# Patient Record
Sex: Female | Born: 1955 | Race: White | Hispanic: No | Smoking: Former smoker
Health system: Southern US, Community
[De-identification: ages and names within clinical notes are randomized; demographics above are authoritative.]

## PROBLEM LIST (undated history)

## (undated) DIAGNOSIS — M1990 Unspecified osteoarthritis, unspecified site: Secondary | ICD-10-CM

## (undated) DIAGNOSIS — F32A Depression, unspecified: Secondary | ICD-10-CM

## (undated) DIAGNOSIS — J189 Pneumonia, unspecified organism: Secondary | ICD-10-CM

## (undated) DIAGNOSIS — I10 Essential (primary) hypertension: Secondary | ICD-10-CM

## (undated) DIAGNOSIS — J449 Chronic obstructive pulmonary disease, unspecified: Secondary | ICD-10-CM

## (undated) DIAGNOSIS — F329 Major depressive disorder, single episode, unspecified: Secondary | ICD-10-CM

## (undated) DIAGNOSIS — F419 Anxiety disorder, unspecified: Secondary | ICD-10-CM

## (undated) HISTORY — PX: BACK SURGERY: SHX140

## (undated) HISTORY — PX: CHOLECYSTECTOMY: SHX55

## (undated) HISTORY — PX: COLONOSCOPY W/ POLYPECTOMY: SHX1380

## (undated) HISTORY — DX: Depression, unspecified: F32.A

## (undated) HISTORY — DX: Anxiety disorder, unspecified: F41.9

## (undated) HISTORY — DX: Major depressive disorder, single episode, unspecified: F32.9

---

## 1975-09-08 HISTORY — PX: GALLBLADDER SURGERY: SHX652

## 1979-06-10 HISTORY — PX: ABDOMINAL HYSTERECTOMY: SHX81

## 1998-11-07 ENCOUNTER — Ambulatory Visit (HOSPITAL_COMMUNITY): Admission: RE | Admit: 1998-11-07 | Discharge: 1998-11-08 | Payer: Self-pay | Admitting: Neurosurgery

## 1998-11-07 ENCOUNTER — Encounter: Payer: Self-pay | Admitting: Neurosurgery

## 1998-12-20 ENCOUNTER — Encounter: Payer: Self-pay | Admitting: Neurosurgery

## 1998-12-20 ENCOUNTER — Ambulatory Visit (HOSPITAL_COMMUNITY): Admission: RE | Admit: 1998-12-20 | Discharge: 1998-12-20 | Payer: Self-pay | Admitting: Neurosurgery

## 1998-12-21 ENCOUNTER — Ambulatory Visit (HOSPITAL_COMMUNITY): Admission: AD | Admit: 1998-12-21 | Discharge: 1998-12-22 | Payer: Self-pay | Admitting: Neurosurgery

## 1998-12-21 ENCOUNTER — Encounter: Payer: Self-pay | Admitting: Neurosurgery

## 2001-06-09 HISTORY — PX: SPINAL CORD STIMULATOR IMPLANT: SHX2422

## 2004-03-14 ENCOUNTER — Ambulatory Visit: Payer: Self-pay | Admitting: Family Medicine

## 2004-03-27 ENCOUNTER — Ambulatory Visit: Payer: Self-pay | Admitting: Physician Assistant

## 2004-04-24 ENCOUNTER — Ambulatory Visit: Payer: Self-pay | Admitting: Physician Assistant

## 2004-05-28 ENCOUNTER — Ambulatory Visit: Payer: Self-pay | Admitting: Physician Assistant

## 2004-06-27 ENCOUNTER — Ambulatory Visit: Payer: Self-pay | Admitting: Physician Assistant

## 2004-07-25 ENCOUNTER — Ambulatory Visit: Payer: Self-pay | Admitting: Physician Assistant

## 2004-08-27 ENCOUNTER — Ambulatory Visit: Payer: Self-pay | Admitting: Physician Assistant

## 2004-09-23 ENCOUNTER — Ambulatory Visit: Payer: Self-pay | Admitting: Pain Medicine

## 2004-09-23 ENCOUNTER — Ambulatory Visit: Payer: Self-pay | Admitting: Physician Assistant

## 2004-10-23 ENCOUNTER — Ambulatory Visit: Payer: Self-pay | Admitting: Physician Assistant

## 2004-11-21 ENCOUNTER — Ambulatory Visit: Payer: Self-pay | Admitting: Physician Assistant

## 2004-12-18 ENCOUNTER — Ambulatory Visit: Payer: Self-pay | Admitting: Physician Assistant

## 2005-01-17 ENCOUNTER — Ambulatory Visit: Payer: Self-pay

## 2005-01-21 ENCOUNTER — Ambulatory Visit: Payer: Self-pay | Admitting: Physician Assistant

## 2005-02-11 ENCOUNTER — Ambulatory Visit: Payer: Self-pay | Admitting: Physician Assistant

## 2005-03-17 ENCOUNTER — Ambulatory Visit: Payer: Self-pay | Admitting: Physician Assistant

## 2005-04-07 ENCOUNTER — Ambulatory Visit: Payer: Self-pay | Admitting: Family Medicine

## 2005-04-15 ENCOUNTER — Ambulatory Visit: Payer: Self-pay | Admitting: Physician Assistant

## 2005-05-16 ENCOUNTER — Ambulatory Visit: Payer: Self-pay | Admitting: Physician Assistant

## 2005-06-18 ENCOUNTER — Ambulatory Visit: Payer: Self-pay | Admitting: Physician Assistant

## 2005-07-17 ENCOUNTER — Ambulatory Visit: Payer: Self-pay | Admitting: Physician Assistant

## 2005-08-12 ENCOUNTER — Ambulatory Visit: Payer: Self-pay | Admitting: Physician Assistant

## 2005-09-11 ENCOUNTER — Ambulatory Visit: Payer: Self-pay | Admitting: Physician Assistant

## 2005-10-13 ENCOUNTER — Ambulatory Visit: Payer: Self-pay | Admitting: Physician Assistant

## 2005-11-13 ENCOUNTER — Ambulatory Visit: Payer: Self-pay | Admitting: Physician Assistant

## 2005-12-12 ENCOUNTER — Ambulatory Visit: Payer: Self-pay | Admitting: Physician Assistant

## 2006-01-09 ENCOUNTER — Ambulatory Visit: Payer: Self-pay | Admitting: Physician Assistant

## 2006-01-19 ENCOUNTER — Ambulatory Visit: Payer: Self-pay | Admitting: Family Medicine

## 2006-02-11 ENCOUNTER — Ambulatory Visit: Payer: Self-pay | Admitting: Pain Medicine

## 2006-03-12 ENCOUNTER — Ambulatory Visit: Payer: Self-pay | Admitting: Physician Assistant

## 2006-04-09 ENCOUNTER — Ambulatory Visit: Payer: Self-pay | Admitting: Physician Assistant

## 2006-05-07 ENCOUNTER — Ambulatory Visit: Payer: Self-pay

## 2006-05-07 ENCOUNTER — Ambulatory Visit: Payer: Self-pay | Admitting: Physician Assistant

## 2006-05-18 ENCOUNTER — Inpatient Hospital Stay: Payer: Self-pay | Admitting: Internal Medicine

## 2006-05-18 ENCOUNTER — Other Ambulatory Visit: Payer: Self-pay

## 2006-05-19 ENCOUNTER — Other Ambulatory Visit: Payer: Self-pay

## 2006-05-27 ENCOUNTER — Ambulatory Visit: Payer: Self-pay | Admitting: Physician Assistant

## 2006-05-28 ENCOUNTER — Ambulatory Visit (HOSPITAL_COMMUNITY): Admission: RE | Admit: 2006-05-28 | Discharge: 2006-05-28 | Payer: Self-pay | Admitting: Gastroenterology

## 2006-06-01 ENCOUNTER — Ambulatory Visit: Payer: Self-pay | Admitting: Internal Medicine

## 2006-06-08 ENCOUNTER — Ambulatory Visit: Payer: Self-pay | Admitting: Gastroenterology

## 2006-06-19 ENCOUNTER — Ambulatory Visit: Payer: Self-pay | Admitting: Gastroenterology

## 2006-07-07 ENCOUNTER — Ambulatory Visit: Payer: Self-pay | Admitting: Physician Assistant

## 2006-08-05 ENCOUNTER — Ambulatory Visit: Payer: Self-pay | Admitting: Physician Assistant

## 2006-09-03 ENCOUNTER — Ambulatory Visit: Payer: Self-pay | Admitting: Physician Assistant

## 2006-09-29 ENCOUNTER — Ambulatory Visit: Payer: Self-pay | Admitting: Physician Assistant

## 2006-10-30 ENCOUNTER — Ambulatory Visit: Payer: Self-pay | Admitting: Physician Assistant

## 2006-12-02 ENCOUNTER — Ambulatory Visit: Payer: Self-pay | Admitting: Physician Assistant

## 2006-12-30 ENCOUNTER — Ambulatory Visit: Payer: Self-pay | Admitting: Physician Assistant

## 2007-02-01 ENCOUNTER — Ambulatory Visit: Payer: Self-pay | Admitting: Physician Assistant

## 2007-03-01 ENCOUNTER — Ambulatory Visit: Payer: Self-pay | Admitting: Family Medicine

## 2007-03-02 ENCOUNTER — Ambulatory Visit: Payer: Self-pay | Admitting: Physician Assistant

## 2007-03-03 ENCOUNTER — Ambulatory Visit: Payer: Self-pay | Admitting: Pain Medicine

## 2007-04-01 ENCOUNTER — Ambulatory Visit: Payer: Self-pay | Admitting: Physician Assistant

## 2007-04-02 ENCOUNTER — Ambulatory Visit: Payer: Self-pay | Admitting: Neonatology

## 2007-04-29 ENCOUNTER — Ambulatory Visit: Payer: Self-pay | Admitting: Physician Assistant

## 2007-05-18 ENCOUNTER — Ambulatory Visit: Payer: Self-pay | Admitting: Pain Medicine

## 2007-05-27 ENCOUNTER — Ambulatory Visit: Payer: Self-pay | Admitting: Physician Assistant

## 2007-06-22 ENCOUNTER — Ambulatory Visit: Payer: Self-pay | Admitting: Physician Assistant

## 2007-07-28 ENCOUNTER — Ambulatory Visit: Payer: Self-pay | Admitting: Physician Assistant

## 2007-08-27 ENCOUNTER — Ambulatory Visit: Payer: Self-pay | Admitting: Physician Assistant

## 2007-10-27 ENCOUNTER — Emergency Department: Payer: Self-pay | Admitting: Emergency Medicine

## 2007-11-02 ENCOUNTER — Other Ambulatory Visit: Payer: Self-pay

## 2007-11-02 ENCOUNTER — Emergency Department: Payer: Self-pay | Admitting: Emergency Medicine

## 2008-03-03 IMAGING — CR DG ELBOW COMPLETE 3+V*L*
1 series · 4 of 4 positions shown · non-contrast
Comparison: none

REASON FOR EXAM: Positive fat pad sign
COMMENTS:

[Series 1: view not recorded · 0.17mm/px · 4 of 4 slices shown]
[im 1/4]
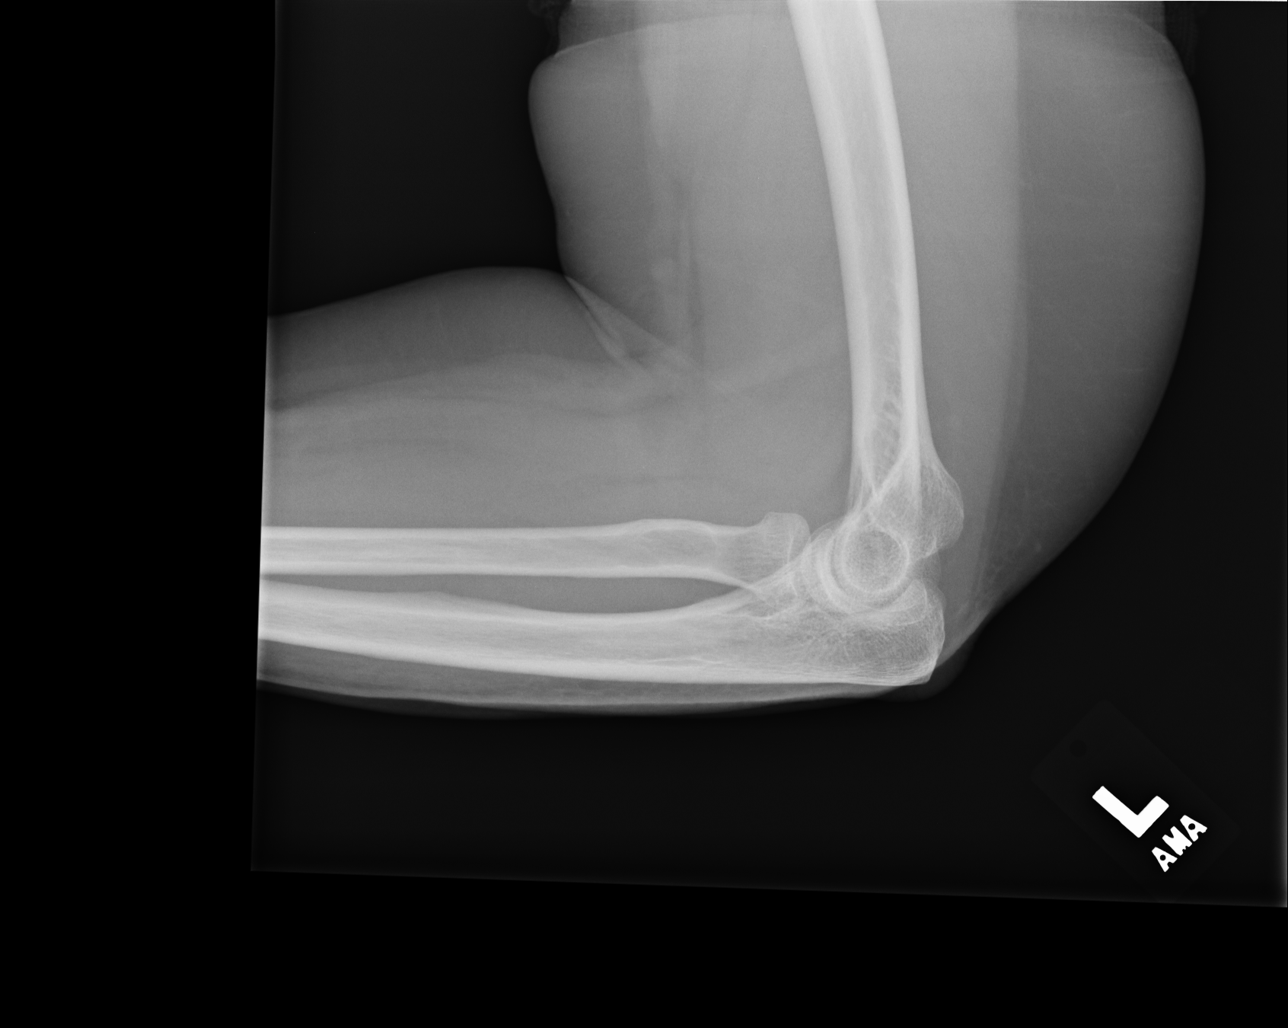
[im 2/4]
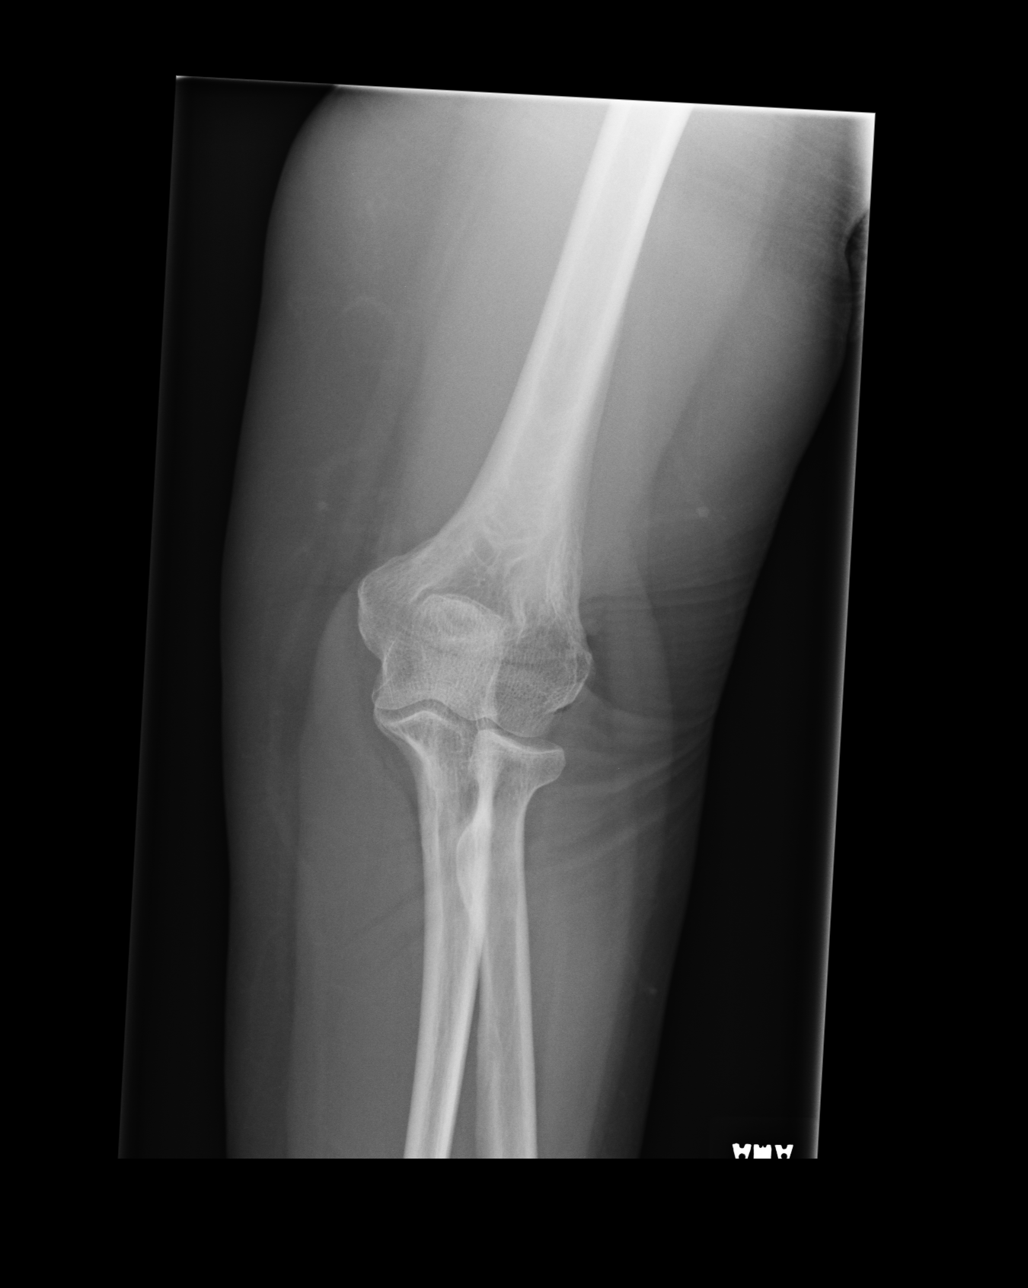
[im 3/4]
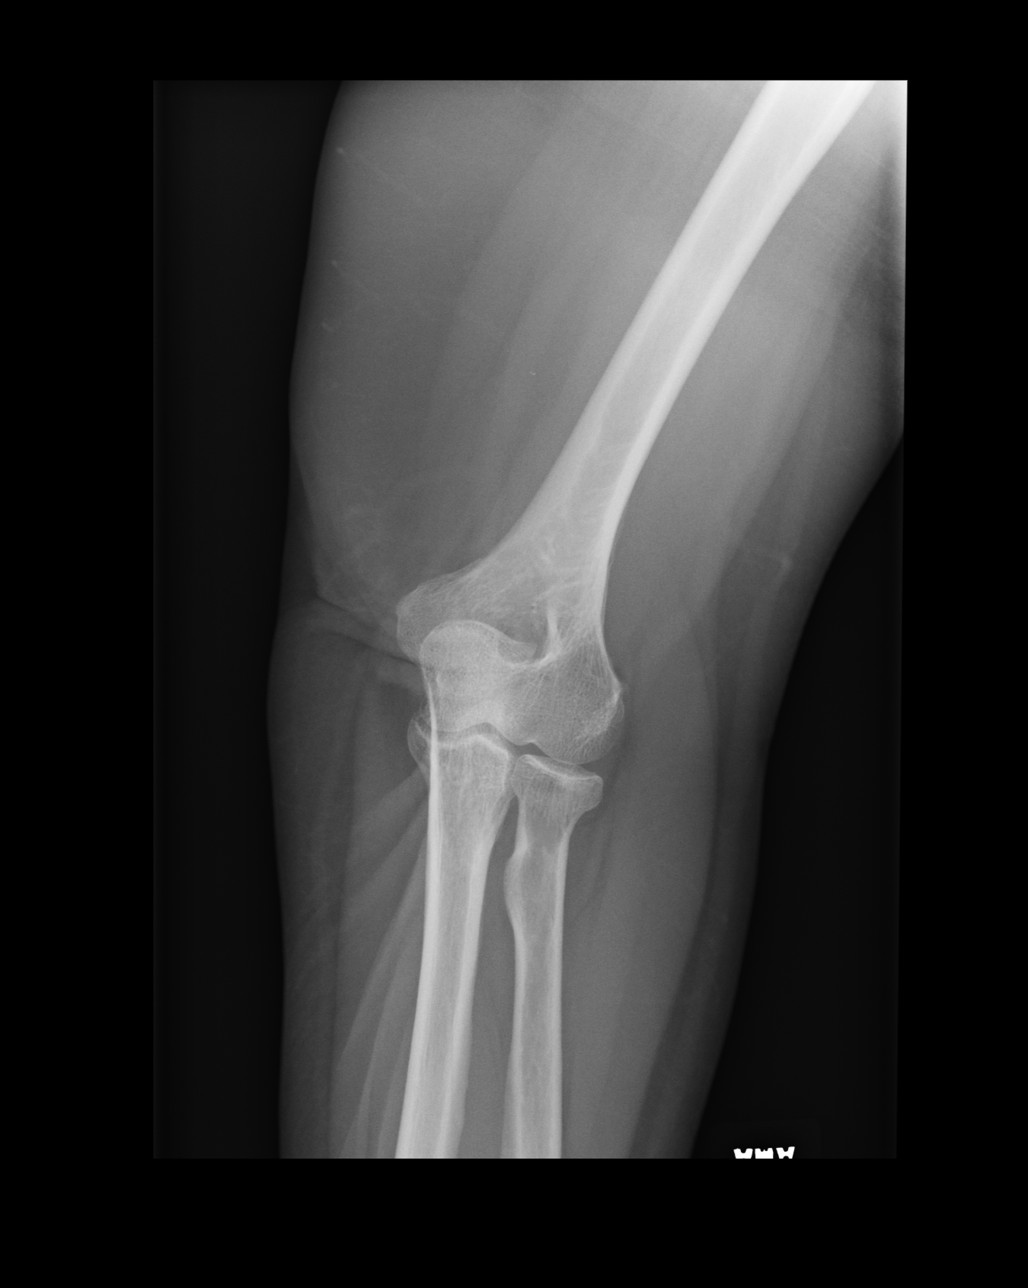
[im 4/4]
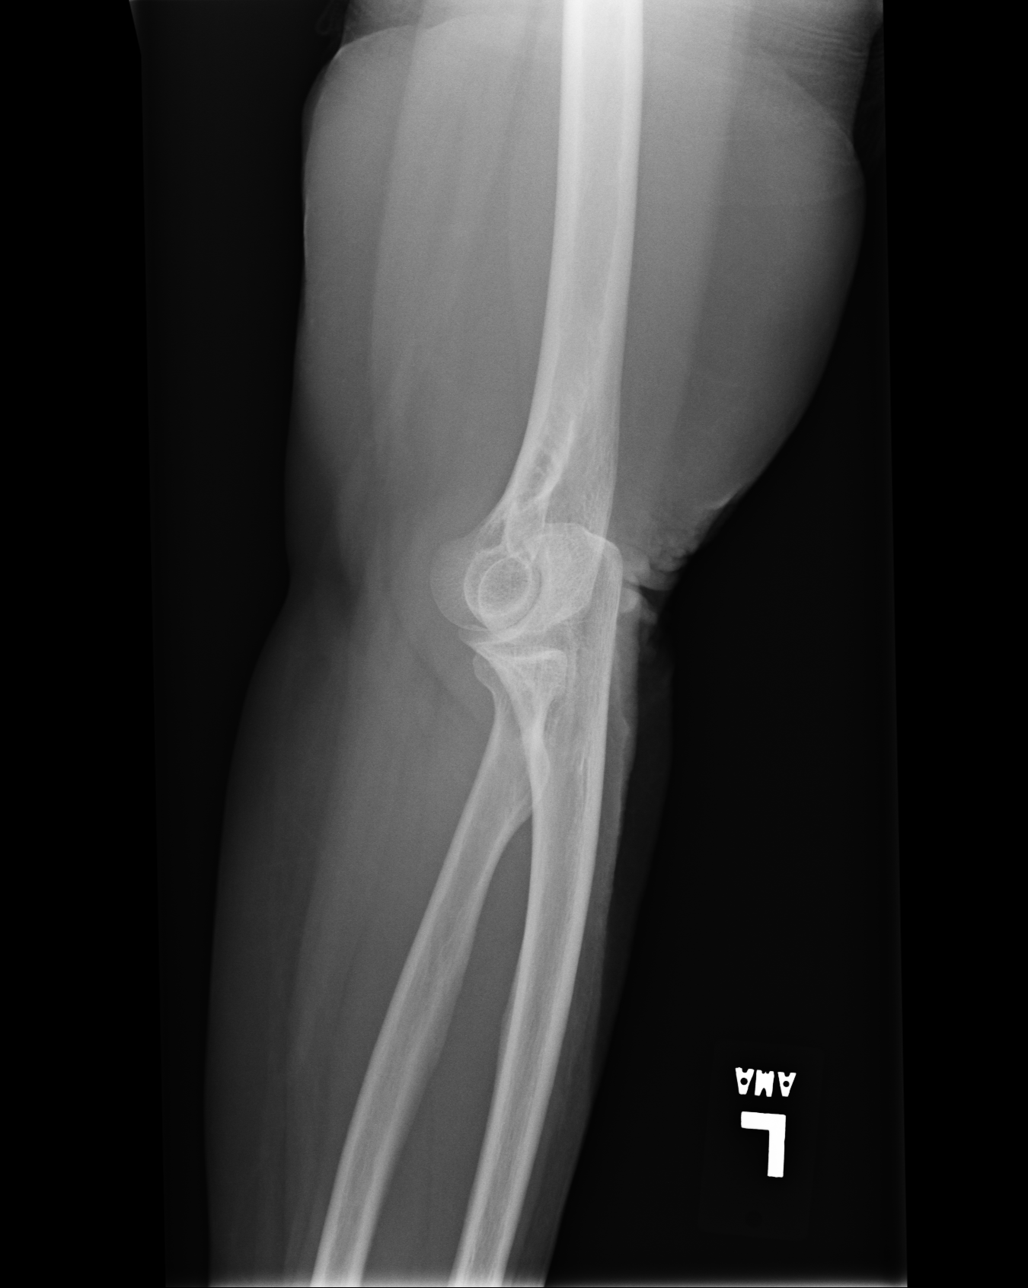

[4 of 4 positions shown; findings below may reference images not displayed]

PROCEDURE:     DXR - DXR ELBOW LT COMP W/OBLIQUES  - April 02, 2007  [DATE]

RESULT:     The bones of the elbow are mildly osteopenic. I do not see
evidence of an acute fracture. No joint effusion is demonstrated. There is
soft tissue prominence both anteriorly and posteriorly over the lower arm. I
do not see more than minimal soft tissue fullness over the olecranon.
IMPRESSION: I do not see acute bony abnormality of the elbow. If there
are worrisome clinical findings in the soft tissues, MRI would be a useful
next step. When compared to a study [DATE], there does appear to be
fullness of the soft tissues of the lower arm which may reflect the presence
of edema or hematoma given the relatively acute onset. No soft tissue gas or
calcification is seen. Underlying neoplasm is felt to be less likely but
again MRI would be a useful next step.

## 2011-08-07 ENCOUNTER — Ambulatory Visit: Payer: Self-pay | Admitting: Internal Medicine

## 2011-09-27 ENCOUNTER — Emergency Department: Payer: Self-pay | Admitting: *Deleted

## 2014-07-05 ENCOUNTER — Encounter: Payer: Self-pay | Admitting: Student

## 2014-07-10 ENCOUNTER — Encounter: Payer: Self-pay | Admitting: Student

## 2014-08-08 ENCOUNTER — Encounter
Admit: 2014-08-08 | Disposition: A | Discharge status: Home / Self Care | Payer: Self-pay | Attending: Student | Admitting: Student

## 2014-09-08 ENCOUNTER — Encounter
Admit: 2014-09-08 | Disposition: A | Discharge status: Home / Self Care | Payer: Self-pay | Attending: Student | Admitting: Student

## 2014-10-12 ENCOUNTER — Ambulatory Visit: Payer: Medicare Other | Attending: Student | Admitting: Physical Therapy

## 2014-10-12 ENCOUNTER — Encounter: Payer: Self-pay | Admitting: Physical Therapy

## 2014-10-12 DIAGNOSIS — M792 Neuralgia and neuritis, unspecified: Secondary | ICD-10-CM | POA: Diagnosis not present

## 2014-10-12 DIAGNOSIS — M961 Postlaminectomy syndrome, not elsewhere classified: Secondary | ICD-10-CM | POA: Insufficient documentation

## 2014-10-12 DIAGNOSIS — M6281 Muscle weakness (generalized): Secondary | ICD-10-CM | POA: Insufficient documentation

## 2014-10-12 DIAGNOSIS — M545 Low back pain, unspecified: Secondary | ICD-10-CM

## 2014-10-12 NOTE — Therapy (Signed)
[place] [facility] [address] [place], [place], [address] Phone: [phone]   Fax:  [phone]  Physical Therapy Treatment  Patient Details  Name: Emma Spears MRN: [id number] Date of Birth: 03-30-1956 Referring Provider:  Buena Irishhidgey, Brooke, MD  Encounter Date: 10/12/2014      PT End of Session - 10/12/14 1202    Visit Number 22   Number of Visits 27   Date for PT Re-Evaluation 11/03/14   Authorization Type 22   Authorization Time Period 30   PT Start Time 1105   PT Stop Time 1200   PT Time Calculation (min) 55 min   Behavior During Therapy [facility] for tasks assessed/performed      Past Medical History  Diagnosis Date  . Anxiety   . Depression     Past Surgical History  Procedure Laterality Date  . Gallbladder surgery N/A 09/1975  . Abdominal hysterectomy N/A 1981  . Back surgery N/A  2 in 2001, 2003,  2 in 2005    There were no vitals filed for this visit.  Visit Diagnosis:  Low back pain, non-specific  Muscle weakness (generalized)      Subjective Assessment - 10/12/14 1123    Subjective Patient reports pain in lower back, bilaterally lefit>right   Limitations Sitting;Standing;Walking;House hold activities   How long can you sit comfortably? 45 min   How long can you stand comfortably? 15 min   How long can you walk comfortably? 30 min.   Patient Stated Goals would like to be able to exercisie to help her walk and get through her day with less difficulty   Currently in Pain? Yes   Pain Score 6    Pain Location Back   Pain Orientation Lower   Pain Descriptors / Indicators Aching;Stabbing;Spasm;Constant;Pressure;Tender   Pain Type Chronic pain   Pain Frequency Constant   Aggravating Factors  sitting, standing , walking   Pain Relieving Factors lying down, back extension, heat, pain medication and implanted pain stimulator            [facility] PT Assessment - 10/12/14 0001    Assessment   Medical Diagnosis post laminectomy syndrome, nerualgia   Onset Date 06/09/12   Next MD Visit 11/03/2014   Precautions   Precautions None   Balance Screen   Has the patient fallen in the past 6 months No              OBJECTIVE:  Observation: Gait: ambulating with forward flexed trunk posture, slower cadence, guarded posture   Treatment: 1. Assisted patient with exercises in supine lying with verbal cuing and assistance for flexibility exercises: hip stretches for hamstring muscles, ER, hip abduction 3-5 reps each  (patient experienced spasms in back during stretching and was instructed in lower trunk rotation and opposite muscle contraction such as hip flexors) performed with assistance: hook lying lower trunk rotation slowly x 15 reps to decrease spasms in lower back with good results followed by additional exercises: with resistive band around thigh: hip abduction x 15 reps, hip flexion against resistive band 3 x 5 reps with controlled motion: sitting  knee extension with 3# ankle weights 2 x 15 reps and knee flexion with resistive band x 15 reps each with verbal cuing for correct technique, pain remained about the same throughout treatment session                     PT Education - 10/12/14 1130  Education provided Yes   Education Details patient educated in              PT Long Term Goals - 10/12/14 1209    PT LONG TERM GOAL #1   Title Patient will report pain level 3/10 max. on NRPS for lower back/ LE    Time 6   Period Weeks   Status Revised   PT LONG TERM GOAL #2   Title Patient will be independent with home program for self management exercises, posture awareness    Time 6   Period Weeks   Status Revised   PT LONG TERM GOAL #3   Title Patient will improve strength to 4/5 to enable pt. to enable pt. to go up/down curbs, squat for getting up from a chair without difficulty    Time 6   Period Weeks   Status On-going   PT LONG TERM GOAL #4    Title Patient will demonstrate improved perceived disability on Modified Oswestry to 30% or less   Time 6   Period Weeks   Status On-going   PT LONG TERM GOAL #5   Title 10MW to 10 seconds indicating improved community ambulation    Time 6   Period Weeks   Status On-going   Additional Long Term Goals   Additional Long Term Goals Yes   PT LONG TERM GOAL #6   Title Patient will improve TUG to 11 seconds or less indicating reduced fall risk    Time 6   Period Weeks   Status On-going               Plan - 10/12/14 1204    Clinical Impression Statement Patient requires assistance to perform stretching and strengthening exercises for LE's and decrease pain. she will benefit form additional physical therapy intervention to  improve strength, flexibility and endurance to improve function with household chores and community ambulation   Pt will benefit from skilled therapeutic intervention in order to improve on the following deficits Difficulty walking;Decreased strength;Pain;Decreased endurance   Rehab Potential Fair   Clinical Impairments Affecting Rehab Potential chronic condition, multiple back surgeries, pain   PT Frequency 2x / week   PT Duration 6 weeks   PT Treatment/Interventions Therapeutic exercise;Manual techniques;Moist Heat   PT Next Visit Plan therapeutic exercise for pain control, improving flexibility and strength in core and LE's   Consulted and Agree with Plan of Care Patient        Problem List There are no active problems to display for this patient.  Beacher MayMarie Brooks, PT  10/12/2014, 12:15 PM  [place] [facility] [address] [place], [place], [address] Phone: [phone]   Fax:  [phone]

## 2014-10-17 ENCOUNTER — Encounter: Payer: Self-pay | Admitting: Physical Therapy

## 2014-10-17 ENCOUNTER — Ambulatory Visit: Payer: Medicare Other | Admitting: Physical Therapy

## 2014-10-17 DIAGNOSIS — M545 Low back pain, unspecified: Secondary | ICD-10-CM

## 2014-10-17 DIAGNOSIS — M961 Postlaminectomy syndrome, not elsewhere classified: Secondary | ICD-10-CM | POA: Diagnosis not present

## 2014-10-17 DIAGNOSIS — M6281 Muscle weakness (generalized): Secondary | ICD-10-CM

## 2014-10-18 NOTE — Therapy (Signed)
[place] [facility] [address] [place], [place], [address] Phone: [phone]   Fax:  [phone]  Physical Therapy Treatment  Patient Details  Name: Emma Spears MRN: [id number] Date of Birth: December 12, 1955 Referring Provider:  Buena Irishhidgey, Brooke, MD  Encounter Date: 10/17/2014      PT End of Session - 10/17/14 1215    Visit Number 23   Number of Visits 27   Date for PT Re-Evaluation 11/03/14   Authorization Type 23   Authorization Time Period 30   PT Start Time 1117   PT Stop Time 1157   PT Time Calculation (min) 40 min   Activity Tolerance Patient tolerated treatment well   Behavior During Therapy [facility] for tasks assessed/performed      Past Medical History  Diagnosis Date  . Anxiety   . Depression     Past Surgical History  Procedure Laterality Date  . Gallbladder surgery N/A 09/1975  . Abdominal hysterectomy N/A 1981  . Back surgery N/A  2 in 2001, 2003,  2 in 2005    There were no vitals filed for this visit.  Visit Diagnosis:  Low back pain, non-specific  Muscle weakness (generalized)      Subjective Assessment - 10/17/14 1124    Subjective Patient reports pain and stiffness in lower back and into both hips and LE's. She reports she also forgot to turn back on her pain stimulator from last session.    Limitations Sitting;Standing;Walking;House hold activities   How long can you sit comfortably? 45 min   How long can you stand comfortably? 15 min   How long can you walk comfortably? 30 min.   Patient Stated Goals would like to be able to exercisie to help her walk and get through her day with less difficulty   Currently in Pain? Yes   Pain Score 6    Pain Location Back   Pain Orientation Lower   Pain Descriptors / Indicators Aching;Pressure;Spasm   Pain Type Chronic pain   Pain Onset More than a month ago   Pain Frequency Constant   Aggravating Factors  standing, sitting and walkng   Pain  Relieving Factors lying down, back extension, heat, pain medication and implanted pain stimulator      OBJECTIVE:  Observation: Gait: ambulating with less forward flexed trunk posture than previous session continues with slow cadence, guarded posture   Treatment: 1. Assisted patient with exercises in supine lying with verbal cuing and assistance for flexibility exercises: hip stretches for hamstring muscles, ER, hip abduction 3-5 reps each (patient had moist heat applied to her back during supine lying exercises per her request) performed with assistance: hook lying lower trunk rotation slowly x 15 reps to decrease spasms in lower back with good results followed by additional exercises: with resistive band around thigh: hip abduction x 15 reps, hip flexion against resistive band 1 x 15 reps with controlled motion, bridging x 10, bridging with resistive band around thighs with hip abduction each bridge x 8 reps, supine lying hip abduction with therapist holding blue resistive band x 10 reps right and 8 reps left LE,   Sitting on stability ball with verbal cuing for good posture alignment and control: cable scapular rows high and low x 15 reps with 15#, 4# bilateral forward elevation to forehead x 10 reps   Response to treatment: improved core control and able to perform exercises with verbal and tactile cues for good alignment of trunk and  LE's. Pain level decreased from 6/10 to 4/10 following moist heat and guided exercises.        PT Education - 10/17/14 1128    Education provided Yes   Education Details re assessed home program including lumbar rotation and strength/core control exercises   Person(s) Educated Patient   Methods Explanation;Demonstration;Verbal cues   Comprehension Verbalized understanding;Returned demonstration;Verbal cues required             PT Long Term Goals - 10/12/14 1209    PT LONG TERM GOAL #1   Title Patient will report pain level 3/10 max. on NRPS for  lower back/ LE    Time 6   Period Weeks   Status Revised   PT LONG TERM GOAL #2   Title Patient will be independent with home program for self management exercises, posture awareness    Time 6   Period Weeks   Status Revised   PT LONG TERM GOAL #3   Title Patient will improve strength to 4/5 to enable pt. to enable pt. to go up/down curbs, squat for getting up from a chair without difficulty    Time 6   Period Weeks   Status On-going   PT LONG TERM GOAL #4   Title Patient will demonstrate improved perceived disability on Modified Oswestry to 30% or less   Time 6   Period Weeks   Status On-going   PT LONG TERM GOAL #5   Title 10MW to 10 seconds indicating improved community ambulation    Time 6   Period Weeks   Status On-going   Additional Long Term Goals   Additional Long Term Goals Yes   PT LONG TERM GOAL #6   Title Patient will improve TUG to 11 seconds or less indicating reduced fall risk    Time 6   Period Weeks   Status On-going            Plan - 10/17/14 1216    Clinical Impression Statement Patient able to perform all exercises with assistance and verbal cuing. She imporved abiltiy to move and exericse with ball under LE's in supine position with lower trunk rotation and hip and knee flexion. She continues with weakness and pain as primary limiting factors to full function without difficulty and will benefit from continued physical therapy intervention to be able to transition to independent home program.    Pt will benefit from skilled therapeutic intervention in order to improve on the following deficits Difficulty walking;Decreased strength;Pain;Decreased endurance   Rehab Potential Fair   Clinical Impairments Affecting Rehab Potential chronic condition, multiple back surgeries, pain   PT Frequency 2x / week   PT Duration 6 weeks   PT Treatment/Interventions Therapeutic exercise;Manual techniques;Moist Heat   PT Next Visit Plan therapeutic exercise for pain  control, improving flexibility and strength in core and LE's        Problem List There are no active problems to display for this patient.  Beacher MayMarie Brooks, PT  10/18/2014, 2:37 PM  [place] [facility] [address] [place], [place], [address] Phone: [phone]   Fax:  [phone]

## 2014-10-19 ENCOUNTER — Ambulatory Visit: Payer: Medicare Other | Admitting: Physical Therapy

## 2014-10-19 ENCOUNTER — Encounter: Payer: Self-pay | Admitting: Physical Therapy

## 2014-10-19 DIAGNOSIS — M545 Low back pain: Secondary | ICD-10-CM

## 2014-10-19 DIAGNOSIS — M6281 Muscle weakness (generalized): Secondary | ICD-10-CM

## 2014-10-19 DIAGNOSIS — M961 Postlaminectomy syndrome, not elsewhere classified: Secondary | ICD-10-CM | POA: Diagnosis not present

## 2014-10-19 NOTE — Therapy (Signed)
[place] [facility] [address] [place], [place], [address] Phone: [phone]   Fax:  [phone]  Physical Therapy Treatment  Patient Details  Name: Emma Spears MRN: [id number] Date of Birth: 1956-04-30 Referring Provider:  Buena Irishhidgey, Brooke, MD  Encounter Date: 10/19/2014      PT End of Session - 10/19/14 1200    Visit Number 24   Number of Visits 27   Date for PT Re-Evaluation 11/03/14   Authorization Type 24   Authorization Time Period 30   PT Start Time 1130   PT Stop Time 1203   PT Time Calculation (min) 33 min   Behavior During Therapy [facility] for tasks assessed/performed      Past Medical History  Diagnosis Date  . Anxiety   . Depression     Past Surgical History  Procedure Laterality Date  . Gallbladder surgery N/A 09/1975  . Abdominal hysterectomy N/A 1981  . Back surgery N/A  2 in 2001, 2003,  2 in 2005    There were no vitals filed for this visit.  Visit Diagnosis:  Bilateral low back pain, with sciatica presence unspecified  Muscle weakness (generalized)      Subjective Assessment - 10/19/14 1620    Subjective Patient reports pain and stiffness in lower back and hips, left>right. She reports that she is feeling much imporved since beginning physical therapy. She continues with left hip pain and stiffness and general back pain.    Limitations Sitting;Standing;Walking;House hold activities   How long can you sit comfortably? 45 min   How long can you stand comfortably? 15 min   How long can you walk comfortably? 30 min.   Patient Stated Goals would like to be able to exercisie to help her walk and get through her day with less difficulty and control her pain better   Currently in Pain? Yes   Pain Score 4    Pain Descriptors / Indicators Aching   Pain Type Chronic pain   Pain Onset More than a month ago   Pain Frequency Constant   Aggravating Factors  standing, sitting and walking   Pain  Relieving Factors resting, lying down, back extension, heat, pain medication and pain stimulator implant      OBJECTIVE:  Observation: Gait: ambulating with less forward flexed trunk posture than previous session continues with slow cadence   Treatment: 1. Assisted patient with exercises in supine lying with verbal cuing and assistance for flexibility exercises: hip stretches for hamstring muscles, ER, hip abduction 5 reps eachperformed with assistance: hook lying lower trunk rotation slowly x 15 reps with both LE's on 45cm ball to decrease spasms in lower back with good results followed by additional exercises: with resistive band around thigh: hip abduction x 15 reps, hip flexion against resistive band 1 x 15 reps with controlled motion, bridging x 10 reps with both LE's on ball,  supine lying hip abduction with therapist guided manual resistance x 10 reps each LE,   Sitting on stability ball with verbal cuing for good posture alignment and control: cable scapular rows high and low x 15 reps with 15#, reverse chin up with lat bar with 20# x 10 reps, 4# bilateral forward elevation to forehead x 10 reps, TRX assisted sit to stand with scapular rows x 10 reps   Response to treatment: improved core control and able to perform exercises with verbal and tactile cues for good alignment of trunk and LE's. Pain level remained  about the same throughout treatment session         PT Education - 10/19/14 1150    Education provided Yes   Education Details re assessed home program for lumbar stabilization and strengthening with verbal cues    Person(s) Educated Patient   Methods Explanation;Verbal cues   Comprehension Verbalized understanding;Verbal cues required             PT Long Term Goals - 10/12/14 1209    PT LONG TERM GOAL #1   Title Patient will report pain level 3/10 max. on NRPS for lower back/ LE    Time 6   Period Weeks   Status Revised   PT LONG TERM GOAL #2   Title  Patient will be independent with home program for self management exercises, posture awareness    Time 6   Period Weeks   Status Revised   PT LONG TERM GOAL #3   Title Patient will improve strength to 4/5 to enable pt. to enable pt. to go up/down curbs, squat for getting up from a chair without difficulty    Time 6   Period Weeks   Status On-going   PT LONG TERM GOAL #4   Title Patient will demonstrate improved perceived disability on Modified Oswestry to 30% or less   Time 6   Period Weeks   Status On-going   PT LONG TERM GOAL #5   Title 10MW to 10 seconds indicating improved community ambulation    Time 6   Period Weeks   Status On-going   Additional Long Term Goals   Additional Long Term Goals Yes   PT LONG TERM GOAL #6   Title Patient will improve TUG to 11 seconds or less indicating reduced fall risk    Time 6   Period Weeks   Status On-going               Plan - 10/19/14 1155    Clinical Impression Statement Patient is progressing wiht decreased back stiffness and improved hip flexibility with treatemnt. She reports she has noticed a good amount of improvement with overall feeling of general increased strength and energy since beginning physical therapy.  She is progressing with goals and continues with pain in back that limits her ability to perform daily activities without difficulty.    Pt will benefit from skilled therapeutic intervention in order to improve on the following deficits Difficulty walking;Decreased strength;Pain;Decreased endurance   Rehab Potential Fair   Clinical Impairments Affecting Rehab Potential chronic condition, multiple back surgeries, pain   PT Frequency 2x / week   PT Duration 6 weeks   PT Treatment/Interventions Therapeutic exercise;Manual techniques;Moist Heat   PT Next Visit Plan therapeutic exercise for pain control, improving flexibility and strength in core and LE's        Problem List There are no active problems to display  for this patient.  Beacher MayMarie Brooks, PT  10/19/2014, 10:49 PM  [place] [facility] [address] [place], [place], [address] Phone: [phone]   Fax:  [phone]

## 2014-10-24 ENCOUNTER — Encounter: Payer: Self-pay | Admitting: Physical Therapy

## 2014-10-26 ENCOUNTER — Encounter: Payer: Self-pay | Admitting: Physical Therapy

## 2014-12-22 ENCOUNTER — Telehealth: Payer: Self-pay | Admitting: Gastroenterology

## 2014-12-22 ENCOUNTER — Other Ambulatory Visit: Payer: Self-pay | Admitting: Internal Medicine

## 2014-12-22 DIAGNOSIS — Z1231 Encounter for screening mammogram for malignant neoplasm of breast: Secondary | ICD-10-CM

## 2014-12-22 NOTE — Telephone Encounter (Signed)
Colonoscopy triage °

## 2014-12-26 ENCOUNTER — Other Ambulatory Visit: Payer: Self-pay | Admitting: Internal Medicine

## 2014-12-26 ENCOUNTER — Ambulatory Visit
Admission: RE | Admit: 2014-12-26 | Discharge: 2014-12-26 | Disposition: A | Discharge status: Home / Self Care | Payer: Medicare Other | Source: Ambulatory Visit | Attending: Internal Medicine | Admitting: Internal Medicine

## 2014-12-26 ENCOUNTER — Other Ambulatory Visit: Payer: Self-pay

## 2014-12-26 DIAGNOSIS — R928 Other abnormal and inconclusive findings on diagnostic imaging of breast: Secondary | ICD-10-CM | POA: Diagnosis not present

## 2014-12-26 DIAGNOSIS — Z1231 Encounter for screening mammogram for malignant neoplasm of breast: Secondary | ICD-10-CM | POA: Diagnosis not present

## 2014-12-26 NOTE — Telephone Encounter (Signed)
Pt was in a meeting. Will call back.

## 2014-12-28 ENCOUNTER — Other Ambulatory Visit: Payer: Self-pay | Admitting: Internal Medicine

## 2014-12-28 DIAGNOSIS — R928 Other abnormal and inconclusive findings on diagnostic imaging of breast: Secondary | ICD-10-CM

## 2014-12-28 DIAGNOSIS — N6310 Unspecified lump in the right breast, unspecified quadrant: Secondary | ICD-10-CM

## 2014-12-28 DIAGNOSIS — N63 Unspecified lump in unspecified breast: Secondary | ICD-10-CM

## 2015-01-02 ENCOUNTER — Ambulatory Visit
Admission: RE | Admit: 2015-01-02 | Discharge: 2015-01-02 | Disposition: A | Discharge status: Home / Self Care | Payer: Medicare Other | Source: Ambulatory Visit | Attending: Internal Medicine | Admitting: Internal Medicine

## 2015-01-02 DIAGNOSIS — N63 Unspecified lump in unspecified breast: Secondary | ICD-10-CM

## 2015-01-02 DIAGNOSIS — R928 Other abnormal and inconclusive findings on diagnostic imaging of breast: Secondary | ICD-10-CM

## 2015-01-02 DIAGNOSIS — N6001 Solitary cyst of right breast: Secondary | ICD-10-CM | POA: Insufficient documentation

## 2015-01-02 DIAGNOSIS — N6002 Solitary cyst of left breast: Secondary | ICD-10-CM | POA: Diagnosis not present

## 2015-01-02 DIAGNOSIS — N6310 Unspecified lump in the right breast, unspecified quadrant: Secondary | ICD-10-CM

## 2015-01-09 NOTE — Telephone Encounter (Signed)
Mailed letter °

## 2015-02-19 ENCOUNTER — Ambulatory Visit: Payer: Medicare Other | Admitting: Gastroenterology

## 2016-06-09 HISTORY — PX: JOINT REPLACEMENT: SHX530

## 2017-05-27 ENCOUNTER — Other Ambulatory Visit: Payer: Self-pay

## 2017-05-27 ENCOUNTER — Encounter
Admission: RE | Admit: 2017-05-27 | Discharge: 2017-05-27 | Disposition: A | Discharge status: Home / Self Care | Payer: Medicare Other | Source: Ambulatory Visit | Attending: Orthopedic Surgery | Admitting: Orthopedic Surgery

## 2017-05-27 DIAGNOSIS — I10 Essential (primary) hypertension: Secondary | ICD-10-CM | POA: Diagnosis not present

## 2017-05-27 HISTORY — DX: Unspecified osteoarthritis, unspecified site: M19.90

## 2017-05-27 LAB — URINALYSIS, COMPLETE (UACMP) WITH MICROSCOPIC
Bacteria, UA: NONE SEEN
Bilirubin Urine: NEGATIVE
GLUCOSE, UA: NEGATIVE mg/dL
Hgb urine dipstick: NEGATIVE
Ketones, ur: NEGATIVE mg/dL
Nitrite: NEGATIVE
PH: 6 (ref 5.0–8.0)
Protein, ur: NEGATIVE mg/dL
SPECIFIC GRAVITY, URINE: 1.02 (ref 1.005–1.030)

## 2017-05-27 LAB — TYPE AND SCREEN
ABO/RH(D): O POS
ANTIBODY SCREEN: NEGATIVE

## 2017-05-27 LAB — SURGICAL PCR SCREEN
MRSA, PCR: NEGATIVE
Staphylococcus aureus: NEGATIVE

## 2017-05-27 LAB — CBC
HCT: 41.1 % (ref 35.0–47.0)
Hemoglobin: 13.9 g/dL (ref 12.0–16.0)
MCH: 31.4 pg (ref 26.0–34.0)
MCHC: 33.8 g/dL (ref 32.0–36.0)
MCV: 92.9 fL (ref 80.0–100.0)
Platelets: 232 10*3/uL (ref 150–440)
RBC: 4.43 MIL/uL (ref 3.80–5.20)
RDW: 14.2 % (ref 11.5–14.5)
WBC: 5.9 10*3/uL (ref 3.6–11.0)

## 2017-05-27 LAB — BASIC METABOLIC PANEL
Anion gap: 6 (ref 5–15)
BUN: 21 mg/dL — AB (ref 6–20)
CHLORIDE: 104 mmol/L (ref 101–111)
CO2: 30 mmol/L (ref 22–32)
CREATININE: 0.72 mg/dL (ref 0.44–1.00)
Calcium: 9.3 mg/dL (ref 8.9–10.3)
GFR calc Af Amer: >60 mL/min (ref >60)
GFR calc non Af Amer: >60 mL/min (ref >60)
GLUCOSE: 92 mg/dL (ref 65–99)
Potassium: 3.7 mmol/L (ref 3.5–5.1)
SODIUM: 140 mmol/L (ref 135–145)

## 2017-05-27 LAB — APTT: aPTT: 31 seconds (ref 24–36)

## 2017-05-27 LAB — PROTIME-INR
INR: 1.01
PROTHROMBIN TIME: 13.2 s (ref 11.4–15.2)

## 2017-05-27 LAB — SEDIMENTATION RATE: SED RATE: 9 mm/h (ref 0–30)

## 2017-05-27 NOTE — Patient Instructions (Signed)
Your procedure is scheduled on:May 28, 2017 (Thursday ) Report to Same  Day Surgery.(MEDICAL MALL ) SECOND FLOOR ARRIVAL TIME 6:00 AM  Remember: Instructions that are not followed completely may result in serious medical risk, up to and including death, or upon the discretion of your surgeon and anesthesiologist your surgery may need to be rescheduled.     _X__ 1. Do not eat food after midnight the night before your procedure.                 No gum chewing or hard candies. You may drink clear liquids up to 2 hours                 before you are scheduled to arrive for your surgery- DO not drink clear                 liquids within 2 hours of the start of your surgery.                 Clear Liquids include:  water, apple juice without pulp, clear carbohydrate                 drink such as Clearfast of Gartorade, Black Coffee or Tea (Do not add                 anything to coffee or tea).     _X__ 2.  No Alcohol for 24 hours before or after surgery.   _X__ 3.  Do Not Smoke or use e-cigarettes For 24 Hours Prior to Your Surgery.                 Do not use any chewable tobacco products for at least 6 hours prior to                 surgery.  ____  4.  Bring all medications with you on the day of surgery if instructed.   __X__  5.  Notify your doctor if there is any change in your medical condition      (cold, fever, infections).     Do not wear jewelry, make-up, hairpins, clips or nail polish. Do not wear lotions, powders, or perfumes. Do not shave 48 hours prior to surgery. Men may shave face and neck. Do not bring valuables to the hospital.    [facility] any belongings or valuables.  Contacts, dentures or bridgework may not be worn into surgery. Leave your suitcase in the car. After surgery it may be brought to your room. For patients admitted to the hospital, discharge time is determined by your treatment team.   Patients  discharged the day of surgery will not be allowed to drive home.   Please read over the following fact sheets that you were given:   MRSA Information          __X__ Take these medicines the morning of surgery with A SIP OF WATER:    1. LEXAPRO  2. GABAPENTIN  3.   4.  5.  6.  ____ Fleet Enema (as directed)   _X___ Use CHG Soap as directed  ____ Use inhalers on the day of surgery  ____ Stop metformin 2 days prior to surgery    ____ Take 1/2 of usual insulin dose the night before surgery. No insulin the morning          of surgery.   __X__ Stop Coumadin/Plavix/aspirin on (NO ASPIRIN )  _X___ Stop Anti-inflammatories on (NO ASPIRIN PRODUCTS ) STOP INDOCIN TODAY )   ____ Stop supplements until after surgery.    ____ Bring C-Pap to the hospital.   BRING SPINAL CORD STIMULATOR REMOTE WITH YOU TO HOSPITAL THE DAY OF SURGERY

## 2017-05-28 ENCOUNTER — Encounter
Admission: RE | Disposition: A | Discharge status: Home Health | Payer: Self-pay | Source: Ambulatory Visit | Attending: Orthopedic Surgery

## 2017-05-28 ENCOUNTER — Other Ambulatory Visit: Payer: Self-pay

## 2017-05-28 ENCOUNTER — Inpatient Hospital Stay: Payer: Medicare Other

## 2017-05-28 ENCOUNTER — Inpatient Hospital Stay: Payer: Medicare Other | Admitting: Anesthesiology

## 2017-05-28 ENCOUNTER — Inpatient Hospital Stay
Admission: RE | Admit: 2017-05-28 | Discharge: 2017-05-30 | DRG: 470 | Disposition: A | Discharge status: Home Health | Payer: Medicare Other | Source: Ambulatory Visit | Attending: Orthopedic Surgery | Admitting: Orthopedic Surgery

## 2017-05-28 ENCOUNTER — Encounter: Payer: Self-pay | Admitting: *Deleted

## 2017-05-28 DIAGNOSIS — Z23 Encounter for immunization: Secondary | ICD-10-CM

## 2017-05-28 DIAGNOSIS — Z8249 Family history of ischemic heart disease and other diseases of the circulatory system: Secondary | ICD-10-CM | POA: Diagnosis not present

## 2017-05-28 DIAGNOSIS — M1612 Unilateral primary osteoarthritis, left hip: Principal | ICD-10-CM | POA: Diagnosis present

## 2017-05-28 DIAGNOSIS — Z79899 Other long term (current) drug therapy: Secondary | ICD-10-CM | POA: Diagnosis not present

## 2017-05-28 DIAGNOSIS — F329 Major depressive disorder, single episode, unspecified: Secondary | ICD-10-CM | POA: Diagnosis present

## 2017-05-28 DIAGNOSIS — Z419 Encounter for procedure for purposes other than remedying health state, unspecified: Secondary | ICD-10-CM

## 2017-05-28 DIAGNOSIS — G8918 Other acute postprocedural pain: Secondary | ICD-10-CM

## 2017-05-28 DIAGNOSIS — I10 Essential (primary) hypertension: Secondary | ICD-10-CM | POA: Diagnosis present

## 2017-05-28 HISTORY — PX: TOTAL HIP ARTHROPLASTY: SHX124

## 2017-05-28 LAB — CBC
HEMATOCRIT: 36.2 % (ref 35.0–47.0)
Hemoglobin: 12 g/dL (ref 12.0–16.0)
MCH: 31.2 pg (ref 26.0–34.0)
MCHC: 33.2 g/dL (ref 32.0–36.0)
MCV: 93.8 fL (ref 80.0–100.0)
Platelets: 192 10*3/uL (ref 150–440)
RBC: 3.86 MIL/uL (ref 3.80–5.20)
RDW: 13.8 % (ref 11.5–14.5)
WBC: 12.1 10*3/uL — AB (ref 3.6–11.0)

## 2017-05-28 LAB — CREATININE, SERUM: Creatinine, Ser: 0.72 mg/dL (ref 0.44–1.00)

## 2017-05-28 LAB — URINE CULTURE: CULTURE: NO GROWTH

## 2017-05-28 LAB — ABO/RH: ABO/RH(D): O POS

## 2017-05-28 SURGERY — ARTHROPLASTY, HIP, TOTAL, ANTERIOR APPROACH
Anesthesia: General | Laterality: Left | Wound class: Clean

## 2017-05-28 MED ORDER — TRANEXAMIC ACID 1000 MG/10ML IV SOLN
1000.0000 mg | INTRAVENOUS | Status: AC
  Administered 2017-05-28: 1000 mg via INTRAVENOUS
  Filled 2017-05-28: qty 10

## 2017-05-28 MED ORDER — LACTATED RINGERS IV SOLN
INTRAVENOUS | Status: DC
  Administered 2017-05-28 (×2): via INTRAVENOUS

## 2017-05-28 MED ORDER — FENTANYL CITRATE (PF) 100 MCG/2ML IJ SOLN
25.0000 ug | INTRAMUSCULAR | Status: AC | PRN
  Administered 2017-05-28 (×2): 25 ug via INTRAVENOUS

## 2017-05-28 MED ORDER — HYDROMORPHONE HCL 1 MG/ML IJ SOLN
1.0000 mg | INTRAMUSCULAR | Status: DC | PRN
  Administered 2017-05-28: 1 mg via INTRAVENOUS
  Filled 2017-05-28: qty 1

## 2017-05-28 MED ORDER — LIDOCAINE HCL (PF) 2 % IJ SOLN
INTRAMUSCULAR | Status: AC
  Filled 2017-05-28: qty 10

## 2017-05-28 MED ORDER — DOCUSATE SODIUM 100 MG PO CAPS
100.0000 mg | ORAL_CAPSULE | Freq: Two times a day (BID) | ORAL | Status: DC
  Administered 2017-05-28 – 2017-05-30 (×5): 100 mg via ORAL
  Filled 2017-05-28 (×5): qty 1

## 2017-05-28 MED ORDER — HYDROMORPHONE HCL 1 MG/ML IJ SOLN
INTRAMUSCULAR | Status: AC
  Administered 2017-05-28: 0.5 mg via INTRAVENOUS
  Filled 2017-05-28: qty 1

## 2017-05-28 MED ORDER — SODIUM CHLORIDE 0.9 % IV SOLN
INTRAVENOUS | Status: DC
  Administered 2017-05-28: 12:00:00 via INTRAVENOUS
  Administered 2017-05-29: 100 mL/h via INTRAVENOUS
  Administered 2017-05-29: 12:00:00 via INTRAVENOUS

## 2017-05-28 MED ORDER — CEFAZOLIN SODIUM-DEXTROSE 2-4 GM/100ML-% IV SOLN: 2.0000 g | Freq: Four times a day (QID) | INTRAVENOUS | Status: DC

## 2017-05-28 MED ORDER — DIPHENHYDRAMINE HCL 12.5 MG/5ML PO ELIX: 12.5000 mg | ORAL_SOLUTION | ORAL | Status: DC | PRN

## 2017-05-28 MED ORDER — NEOMYCIN-POLYMYXIN B GU 40-200000 IR SOLN
Status: AC
  Filled 2017-05-28: qty 20

## 2017-05-28 MED ORDER — FENTANYL CITRATE (PF) 100 MCG/2ML IJ SOLN
INTRAMUSCULAR | Status: AC
  Filled 2017-05-28: qty 2

## 2017-05-28 MED ORDER — MAGNESIUM HYDROXIDE 400 MG/5ML PO SUSP
30.0000 mL | Freq: Every day | ORAL | Status: DC | PRN
  Administered 2017-05-28 – 2017-05-29 (×2): 30 mL via ORAL
  Filled 2017-05-28 (×2): qty 30

## 2017-05-28 MED ORDER — ROCURONIUM BROMIDE 50 MG/5ML IV SOLN
INTRAVENOUS | Status: AC
  Filled 2017-05-28: qty 1

## 2017-05-28 MED ORDER — ACETAMINOPHEN 10 MG/ML IV SOLN
INTRAVENOUS | Status: DC | PRN
  Administered 2017-05-28: 1000 mg via INTRAVENOUS

## 2017-05-28 MED ORDER — OXYCODONE HCL 5 MG PO TABS
5.0000 mg | ORAL_TABLET | ORAL | Status: DC | PRN
  Administered 2017-05-28 – 2017-05-29 (×4): 5 mg via ORAL
  Filled 2017-05-28 (×4): qty 1

## 2017-05-28 MED ORDER — FAMOTIDINE 20 MG PO TABS
20.0000 mg | ORAL_TABLET | Freq: Once | ORAL | Status: AC
  Administered 2017-05-28: 20 mg via ORAL

## 2017-05-28 MED ORDER — SUCCINYLCHOLINE CHLORIDE 20 MG/ML IJ SOLN
INTRAMUSCULAR | Status: DC | PRN
  Administered 2017-05-28: 100 mg via INTRAVENOUS

## 2017-05-28 MED ORDER — MAGNESIUM CITRATE PO SOLN
1.0000 | Freq: Once | ORAL | Status: DC | PRN
  Filled 2017-05-28: qty 296

## 2017-05-28 MED ORDER — SUGAMMADEX SODIUM 200 MG/2ML IV SOLN
INTRAVENOUS | Status: DC | PRN
  Administered 2017-05-28: 186.8 mg via INTRAVENOUS

## 2017-05-28 MED ORDER — BUPIVACAINE-EPINEPHRINE (PF) 0.25% -1:200000 IJ SOLN
INTRAMUSCULAR | Status: AC
  Filled 2017-05-28: qty 30

## 2017-05-28 MED ORDER — ACETAMINOPHEN 325 MG PO TABS
650.0000 mg | ORAL_TABLET | ORAL | Status: DC | PRN
  Administered 2017-05-29 (×2): 650 mg via ORAL
  Filled 2017-05-28 (×2): qty 2

## 2017-05-28 MED ORDER — DEXAMETHASONE SODIUM PHOSPHATE 10 MG/ML IJ SOLN
INTRAMUSCULAR | Status: AC
  Filled 2017-05-28: qty 1

## 2017-05-28 MED ORDER — PHENOL 1.4 % MT LIQD
1.0000 | OROMUCOSAL | Status: DC | PRN
  Filled 2017-05-28: qty 177

## 2017-05-28 MED ORDER — ESCITALOPRAM OXALATE 10 MG PO TABS
20.0000 mg | ORAL_TABLET | Freq: Every day | ORAL | Status: DC
Start: 2017-05-29 — End: 2017-05-30
  Administered 2017-05-29 – 2017-05-30 (×2): 20 mg via ORAL
  Filled 2017-05-28 (×2): qty 2

## 2017-05-28 MED ORDER — FENTANYL CITRATE (PF) 100 MCG/2ML IJ SOLN
INTRAMUSCULAR | Status: AC
  Administered 2017-05-28: 25 ug via INTRAVENOUS
  Filled 2017-05-28: qty 2

## 2017-05-28 MED ORDER — GABAPENTIN 300 MG PO CAPS
900.0000 mg | ORAL_CAPSULE | Freq: Three times a day (TID) | ORAL | Status: DC
  Administered 2017-05-28 – 2017-05-30 (×6): 900 mg via ORAL
  Filled 2017-05-28 (×6): qty 3

## 2017-05-28 MED ORDER — SUGAMMADEX SODIUM 200 MG/2ML IV SOLN
INTRAVENOUS | Status: AC
  Filled 2017-05-28: qty 2

## 2017-05-28 MED ORDER — ONDANSETRON HCL 4 MG/2ML IJ SOLN
INTRAMUSCULAR | Status: AC
  Filled 2017-05-28: qty 2

## 2017-05-28 MED ORDER — ONDANSETRON HCL 4 MG/2ML IJ SOLN: 4.0000 mg | Freq: Four times a day (QID) | INTRAMUSCULAR | Status: DC | PRN

## 2017-05-28 MED ORDER — LIDOCAINE HCL (CARDIAC) 20 MG/ML IV SOLN
INTRAVENOUS | Status: DC | PRN
  Administered 2017-05-28: 100 mg via INTRAVENOUS

## 2017-05-28 MED ORDER — ONDANSETRON HCL 4 MG/2ML IJ SOLN: 4.0000 mg | Freq: Once | INTRAMUSCULAR | Status: DC | PRN

## 2017-05-28 MED ORDER — MIDAZOLAM HCL 2 MG/2ML IJ SOLN
INTRAMUSCULAR | Status: DC | PRN
Start: 2017-05-28 — End: 2017-05-28
  Administered 2017-05-28: 2 mg via INTRAVENOUS

## 2017-05-28 MED ORDER — NEOMYCIN-POLYMYXIN B GU 40-200000 IR SOLN
Status: DC | PRN
  Administered 2017-05-28: 4 mL

## 2017-05-28 MED ORDER — METOCLOPRAMIDE HCL 10 MG PO TABS: 5.0000 mg | ORAL_TABLET | Freq: Three times a day (TID) | ORAL | Status: DC | PRN

## 2017-05-28 MED ORDER — FENTANYL CITRATE (PF) 100 MCG/2ML IJ SOLN
25.0000 ug | INTRAMUSCULAR | Status: AC | PRN
  Administered 2017-05-28 (×6): 25 ug via INTRAVENOUS

## 2017-05-28 MED ORDER — PROPOFOL 500 MG/50ML IV EMUL
INTRAVENOUS | Status: AC
  Filled 2017-05-28: qty 50

## 2017-05-28 MED ORDER — ALUM & MAG HYDROXIDE-SIMETH 200-200-20 MG/5ML PO SUSP: 30.0000 mL | ORAL | Status: DC | PRN

## 2017-05-28 MED ORDER — PROPOFOL 10 MG/ML IV BOLUS
INTRAVENOUS | Status: DC | PRN
  Administered 2017-05-28: 150 mg via INTRAVENOUS

## 2017-05-28 MED ORDER — METOCLOPRAMIDE HCL 5 MG/ML IJ SOLN: 5.0000 mg | Freq: Three times a day (TID) | INTRAMUSCULAR | Status: DC | PRN

## 2017-05-28 MED ORDER — MIDAZOLAM HCL 2 MG/2ML IJ SOLN
INTRAMUSCULAR | Status: AC
  Filled 2017-05-28: qty 2

## 2017-05-28 MED ORDER — DEXAMETHASONE SODIUM PHOSPHATE 10 MG/ML IJ SOLN
INTRAMUSCULAR | Status: DC | PRN
  Administered 2017-05-28: 10 mg via INTRAVENOUS

## 2017-05-28 MED ORDER — FENTANYL CITRATE (PF) 100 MCG/2ML IJ SOLN
INTRAMUSCULAR | Status: DC | PRN
  Administered 2017-05-28: 100 ug via INTRAVENOUS
  Administered 2017-05-28 (×3): 50 ug via INTRAVENOUS
  Administered 2017-05-28: 100 ug via INTRAVENOUS
  Administered 2017-05-28: 50 ug via INTRAVENOUS

## 2017-05-28 MED ORDER — BUPIVACAINE-EPINEPHRINE 0.25% -1:200000 IJ SOLN
INTRAMUSCULAR | Status: DC | PRN
  Administered 2017-05-28: 30 mL

## 2017-05-28 MED ORDER — DEXTROSE 5 % IV SOLN
500.0000 mg | Freq: Four times a day (QID) | INTRAVENOUS | Status: DC | PRN
  Filled 2017-05-28: qty 5

## 2017-05-28 MED ORDER — CEFAZOLIN SODIUM-DEXTROSE 2-4 GM/100ML-% IV SOLN
2.0000 g | Freq: Once | INTRAVENOUS | Status: AC
  Administered 2017-05-28: 2 g via INTRAVENOUS

## 2017-05-28 MED ORDER — ORAL CARE MOUTH RINSE
15.0000 mL | Freq: Two times a day (BID) | OROMUCOSAL | Status: DC
  Administered 2017-05-29: 15 mL via OROMUCOSAL

## 2017-05-28 MED ORDER — MENTHOL 3 MG MT LOZG
1.0000 | LOZENGE | OROMUCOSAL | Status: DC | PRN
  Filled 2017-05-28: qty 9

## 2017-05-28 MED ORDER — MORPHINE SULFATE ER 15 MG PO TBCR
15.0000 mg | EXTENDED_RELEASE_TABLET | Freq: Three times a day (TID) | ORAL | Status: DC
  Administered 2017-05-28 – 2017-05-30 (×6): 15 mg via ORAL
  Filled 2017-05-28 (×6): qty 1

## 2017-05-28 MED ORDER — SUCCINYLCHOLINE CHLORIDE 20 MG/ML IJ SOLN
INTRAMUSCULAR | Status: AC
  Filled 2017-05-28: qty 1

## 2017-05-28 MED ORDER — OXYCODONE HCL 5 MG PO TABS
10.0000 mg | ORAL_TABLET | ORAL | Status: DC | PRN
  Administered 2017-05-28 – 2017-05-30 (×4): 10 mg via ORAL
  Filled 2017-05-28 (×4): qty 2

## 2017-05-28 MED ORDER — DEXTROSE 5 % IV SOLN
2.0000 g | Freq: Four times a day (QID) | INTRAVENOUS | Status: AC
  Administered 2017-05-28 – 2017-05-29 (×3): 2 g via INTRAVENOUS
  Filled 2017-05-28 (×3): qty 2000

## 2017-05-28 MED ORDER — HYDROMORPHONE HCL 1 MG/ML IJ SOLN
0.5000 mg | INTRAMUSCULAR | Status: AC | PRN
  Administered 2017-05-28 (×4): 0.5 mg via INTRAVENOUS

## 2017-05-28 MED ORDER — ZOLPIDEM TARTRATE 5 MG PO TABS
5.0000 mg | ORAL_TABLET | Freq: Every evening | ORAL | Status: DC | PRN
  Administered 2017-05-29: 5 mg via ORAL
  Filled 2017-05-28: qty 1

## 2017-05-28 MED ORDER — INFLUENZA VAC SPLIT QUAD 0.5 ML IM SUSY
0.5000 mL | PREFILLED_SYRINGE | INTRAMUSCULAR | Status: AC
  Administered 2017-05-30: 0.5 mL via INTRAMUSCULAR
  Filled 2017-05-28: qty 0.5

## 2017-05-28 MED ORDER — ROCURONIUM BROMIDE 100 MG/10ML IV SOLN
INTRAVENOUS | Status: DC | PRN
  Administered 2017-05-28: 10 mg via INTRAVENOUS

## 2017-05-28 MED ORDER — ONDANSETRON HCL 4 MG PO TABS: 4.0000 mg | ORAL_TABLET | Freq: Four times a day (QID) | ORAL | Status: DC | PRN

## 2017-05-28 MED ORDER — ONDANSETRON HCL 4 MG/2ML IJ SOLN
INTRAMUSCULAR | Status: DC | PRN
  Administered 2017-05-28: 4 mg via INTRAVENOUS

## 2017-05-28 MED ORDER — ACETAMINOPHEN 650 MG RE SUPP: 650.0000 mg | RECTAL | Status: DC | PRN

## 2017-05-28 MED ORDER — BISACODYL 10 MG RE SUPP
10.0000 mg | Freq: Every day | RECTAL | Status: DC | PRN
  Administered 2017-05-29: 10 mg via RECTAL
  Filled 2017-05-28: qty 1

## 2017-05-28 MED ORDER — ENOXAPARIN SODIUM 40 MG/0.4ML ~~LOC~~ SOLN
40.0000 mg | SUBCUTANEOUS | Status: DC
  Administered 2017-05-29 – 2017-05-30 (×2): 40 mg via SUBCUTANEOUS
  Filled 2017-05-28 (×2): qty 0.4

## 2017-05-28 MED ORDER — METHOCARBAMOL 500 MG PO TABS
500.0000 mg | ORAL_TABLET | Freq: Four times a day (QID) | ORAL | Status: DC | PRN
  Administered 2017-05-28 – 2017-05-29 (×4): 500 mg via ORAL
  Filled 2017-05-28 (×4): qty 1

## 2017-05-28 MED ORDER — ACETAMINOPHEN 10 MG/ML IV SOLN
INTRAVENOUS | Status: AC
  Filled 2017-05-28: qty 100

## 2017-05-28 SURGICAL SUPPLY — 53 items
BLADE SAW SAG 18.5X105 (BLADE) ×2 IMPLANT
BNDG COHESIVE 6X5 TAN STRL LF (GAUZE/BANDAGES/DRESSINGS) ×6 IMPLANT
CANISTER SUCT 1200ML W/VALVE (MISCELLANEOUS) ×2 IMPLANT
CAPT HIP TOTAL 3 ×1 IMPLANT
CHLORAPREP W/TINT 26ML (MISCELLANEOUS) ×2 IMPLANT
DRAPE C-ARM XRAY 36X54 (DRAPES) ×2 IMPLANT
DRAPE INCISE IOBAN 66X60 STRL (DRAPES) IMPLANT
DRAPE POUCH INSTRU U-SHP 10X18 (DRAPES) ×2 IMPLANT
DRAPE SHEET LG 3/4 BI-LAMINATE (DRAPES) ×6 IMPLANT
DRAPE TABLE BACK 80X90 (DRAPES) ×2 IMPLANT
DRESSING SURGICEL FIBRLLR 1X2 (HEMOSTASIS) ×2 IMPLANT
DRSG OPSITE POSTOP 4X8 (GAUZE/BANDAGES/DRESSINGS) ×4 IMPLANT
DRSG SURGICEL FIBRILLAR 1X2 (HEMOSTASIS) ×4
ELECT BLADE 6.5 EXT (BLADE) ×2 IMPLANT
ELECT REM PT RETURN 9FT ADLT (ELECTROSURGICAL) ×2
ELECTRODE REM PT RTRN 9FT ADLT (ELECTROSURGICAL) ×1 IMPLANT
EVACUATOR 1/8 PVC DRAIN (DRAIN) ×1 IMPLANT
GLOVE BIOGEL PI IND STRL 9 (GLOVE) ×1 IMPLANT
GLOVE BIOGEL PI INDICATOR 9 (GLOVE) ×1
GLOVE SURG SYN 9.0  PF PI (GLOVE) ×2
GLOVE SURG SYN 9.0 PF PI (GLOVE) ×2 IMPLANT
GOWN SRG 2XL LVL 4 RGLN SLV (GOWNS) ×1 IMPLANT
GOWN STRL NON-REIN 2XL LVL4 (GOWNS) ×2
GOWN STRL REUS W/ TWL LRG LVL3 (GOWN DISPOSABLE) ×1 IMPLANT
GOWN STRL REUS W/TWL LRG LVL3 (GOWN DISPOSABLE) ×2
HOLDER FOLEY CATH W/STRAP (MISCELLANEOUS) ×2 IMPLANT
HOOD PEEL AWAY FLYTE STAYCOOL (MISCELLANEOUS) ×2 IMPLANT
KIT PREVENA INCISION MGT 13 (CANNISTER) ×2 IMPLANT
MAT BLUE FLOOR 46X72 FLO (MISCELLANEOUS) ×2 IMPLANT
NDL SAFETY ECLIPSE 18X1.5 (NEEDLE) ×1 IMPLANT
NDL SPNL 18GX3.5 QUINCKE PK (NEEDLE) ×1 IMPLANT
NEEDLE HYPO 18GX1.5 SHARP (NEEDLE) ×2
NEEDLE SPNL 18GX3.5 QUINCKE PK (NEEDLE) ×2 IMPLANT
NS IRRIG 1000ML POUR BTL (IV SOLUTION) ×2 IMPLANT
PACK HIP COMPR (MISCELLANEOUS) ×2 IMPLANT
SOL PREP PVP 2OZ (MISCELLANEOUS) ×2
SOLUTION PREP PVP 2OZ (MISCELLANEOUS) ×1 IMPLANT
SPONGE DRAIN TRACH 4X4 STRL 2S (GAUZE/BANDAGES/DRESSINGS) ×2 IMPLANT
STAPLER SKIN PROX 35W (STAPLE) ×2 IMPLANT
STRAP SAFETY BODY (MISCELLANEOUS) ×2 IMPLANT
SUT DVC 2 QUILL PDO  T11 36X36 (SUTURE) ×1
SUT DVC 2 QUILL PDO T11 36X36 (SUTURE) ×1 IMPLANT
SUT SILK 0 (SUTURE) ×2
SUT SILK 0 30XBRD TIE 6 (SUTURE) ×1 IMPLANT
SUT V-LOC 90 ABS DVC 3-0 CL (SUTURE) ×2 IMPLANT
SUT VIC AB 1 CT1 36 (SUTURE) ×2 IMPLANT
SYR 20CC LL (SYRINGE) ×2 IMPLANT
SYR 30ML LL (SYRINGE) ×2 IMPLANT
SYR BULB IRRIG 60ML STRL (SYRINGE) ×2 IMPLANT
TAPE MICROFOAM 4IN (TAPE) ×2 IMPLANT
TOWEL OR 17X26 4PK STRL BLUE (TOWEL DISPOSABLE) ×2 IMPLANT
TRAY FOLEY W/METER SILVER 16FR (SET/KITS/TRAYS/PACK) ×2 IMPLANT
WND VAC CANISTER 500ML (MISCELLANEOUS) ×2 IMPLANT

## 2017-05-28 NOTE — Op Note (Signed)
05/28/2017  9:05 AM  PATIENT:  Emma Spears  61 y.o. female  PRE-OPERATIVE DIAGNOSIS:  HIP OSTEOARTHRITIS LEFT  POST-OPERATIVE DIAGNOSIS:  HIP OSTEOARTHRITIS LEFT  PROCEDURE:  Procedure(s): TOTAL HIP ARTHROPLASTY ANTERIOR APPROACH (Left)  SURGEON: Leitha SchullerMichael J Zayvon Alicea, MD  ASSISTANTS: None  ANESTHESIA:   general  EBL:  Total I/O In: 1000 [I.V.:1000] Out: 1100 [Urine:100; Blood:1000]  BLOOD ADMINISTERED:none  DRAINS: none   LOCAL MEDICATIONS USED:  MARCAINE     SPECIMEN:  Source of Specimen:  Left femoral head  DISPOSITION OF SPECIMEN:  PATHOLOGY  COUNTS:  YES  TOURNIQUET:  * No tourniquets in log *  IMPLANTS: Medacta AMIS 3 standard stem, 56 mm  Mpact DM cup and liner with S metal 28 mm head  DICTATION: .Dragon Dictation   The patient was brought to the operating room and after general anesthesia was obtained patient was placed on the operative table with the ipsilateral foot into the Medacta attachment, contralateral leg on a well-padded table. C-arm was brought in and preop template x-ray taken. After prepping and draping in usual sterile fashion appropriate patient identification and timeout procedures were completed. Anterior approach to the hip was obtained and centered over the greater trochanter and TFL muscle. The subcutaneous tissue was incised hemostasis being achieved by electrocautery. TFL fascia was incised and the muscle retracted laterally deep retractor placed. The lateral femoral circumflex vessels were identified and ligated. The anterior capsule was exposed and a capsulotomy performed. The neck was identified and a femoral neck cut carried out with a saw. The head was removed without difficulty and showed sclerotic femoral head and acetabulum. Reaming was carried out to 54 mm and a 56 mm cup trial gave appropriate tightness to the acetabular component a 56 DM cup was impacted into position. The leg was then externally rotated and ischiofemoral and pubofemoral  releases carried out. The femur was sequentially broached to a size 3, size 3 standard width S head trials were placed and the final components chosen. The 3 standard stem was inserted along with a metal S 28 mm head and 56 mm liner. The hip was reduced and was stable the wound was thoroughly irrigated and fibrillar placed on the posterior capsule incision and medial capsular neck where there had been quite a bit of oozing during the procedure. The deep fascia was closed using a heavy Quill after infiltration of 30 cc of quarter percent Sensorcaine with epinephrine. 3-0 v-loc used for subcuticular closure followed by skin staples and incisional wound VAC  PLAN OF CARE: Admit to inpatient

## 2017-05-28 NOTE — Anesthesia Postprocedure Evaluation (Signed)
Anesthesia Post Note  Patient: Chana BodeVictoria R Eisenbeis  Procedure(s) Performed: TOTAL HIP ARTHROPLASTY ANTERIOR APPROACH (Left )  Patient location during evaluation: PACU Anesthesia Type: General Level of consciousness: awake and alert and oriented Pain management: pain level controlled Vital Signs Assessment: post-procedure vital signs reviewed and stable Respiratory status: spontaneous breathing Cardiovascular status: blood pressure returned to baseline Anesthetic complications: no     Last Vitals:  Vitals:   05/28/17 1339 05/28/17 1426  BP: (!) 121/55 139/78  Pulse: 85 96  Resp: 16 18  Temp: 36.9 C 36.9 C  SpO2: 98% 99%    Last Pain:  Vitals:   05/28/17 1430  TempSrc:   PainSc: 2                  Celia Friedland

## 2017-05-28 NOTE — H&P (Signed)
Reviewed paper H+P, will be scanned into chart. No changes noted.  

## 2017-05-28 NOTE — NC FL2 (Signed)
[place] MEDICAID FL2 LEVEL OF CARE SCREENING TOOL     IDENTIFICATION  Patient Name: Emma Spears Birthdate: 1955/08/28 Sex: female Admission Date (Current Location): 05/28/2017  [place]   Facility and Address:  [facility], [address], [place], [place] [address]      Provider Number: [id number]  Attending Physician Name and Address:  Kennedy BuckerMenz, Michael, MD  Relative Name and Phone Number:       Current Level of Care: Hospital Recommended Level of Care: Skilled Nursing Facility Prior Approval Number:    Date Approved/Denied:   PASRR Number: ([id number])  Discharge Plan: SNF    Current Diagnoses: Patient Active Problem List   Diagnosis Date Noted  . Primary localized osteoarthritis of left hip 05/28/2017    Orientation RESPIRATION BLADDER Height & Weight     Self, Time, Situation, Place  O2(1 liter oxygen. ) Continent Weight: 206 lb (93.4 kg) Height:  5\' 2"  (157.5 cm)  BEHAVIORAL SYMPTOMS/MOOD NEUROLOGICAL BOWEL NUTRITION STATUS      Continent Diet(Regular Diet. )  AMBULATORY STATUS COMMUNICATION OF NEEDS Skin   Extensive Assist Verbally Surgical wounds, Wound Vac(Incision: Left Hip, provena wound vac. )                       Personal Care Assistance Level of Assistance  Bathing, Feeding, Dressing Bathing Assistance: Limited assistance Feeding assistance: Independent Dressing Assistance: Limited assistance     Functional Limitations Info  Sight, Hearing, Speech Sight Info: Adequate Hearing Info: Adequate Speech Info: Adequate    SPECIAL CARE FACTORS FREQUENCY  PT (By licensed PT), OT (By licensed OT)     PT Frequency: (5) OT Frequency: (5)            Contractures      Additional Factors Info  Code Status, Allergies Code Status Info: (Full Code. ) Allergies Info: (Vicodin Hydrocodone-acetaminophen)           Current Medications (05/28/2017):  This is the current hospital  active medication list Current Facility-Administered Medications  Medication Dose Route Frequency Provider Last Rate Last Dose  . 0.9 %  sodium chloride infusion   Intravenous Continuous Kennedy BuckerMenz, Michael, MD 100 mL/hr at 05/28/17 1146    . acetaminophen (TYLENOL) tablet 650 mg  650 mg Oral Q4H PRN Kennedy BuckerMenz, Michael, MD       Or  . acetaminophen (TYLENOL) suppository 650 mg  650 mg Rectal Q4H PRN Kennedy BuckerMenz, Michael, MD      . alum & mag hydroxide-simeth (MAALOX/MYLANTA) 200-200-20 MG/5ML suspension 30 mL  30 mL Oral Q4H PRN Kennedy BuckerMenz, Michael, MD      . bisacodyl (DULCOLAX) suppository 10 mg  10 mg Rectal Daily PRN Kennedy BuckerMenz, Michael, MD      . ceFAZolin (ANCEF) 2 g in dextrose 5 % 100 mL IVPB  2 g Intravenous Q6H Kennedy BuckerMenz, Michael, MD   Stopped at 05/28/17 1509  . diphenhydrAMINE (BENADRYL) 12.5 MG/5ML elixir 12.5-25 mg  12.5-25 mg Oral Q4H PRN Kennedy BuckerMenz, Michael, MD      . docusate sodium (COLACE) capsule 100 mg  100 mg Oral BID Kennedy BuckerMenz, Michael, MD   100 mg at 05/28/17 1330  . [START ON 05/29/2017] enoxaparin (LOVENOX) injection 40 mg  40 mg Subcutaneous Q24H Kennedy BuckerMenz, Michael, MD      . [name][START ON 05/29/2017] escitalopram (LEXAPRO) tablet 20 mg  20 mg Oral Daily Kennedy BuckerMenz, Michael, MD      . gabapentin (NEURONTIN) capsule 900 mg  900 mg Oral TID Kennedy BuckerMenz, Michael, MD   900 mg at 05/28/17 1648  . HYDROmorphone (DILAUDID) injection 1 mg  1 mg Intravenous Q2H PRN Kennedy BuckerMenz, Michael, MD      . [name][START ON 05/29/2017] Influenza vac split quadrivalent PF (FLUARIX) injection 0.5 mL  0.5 mL Intramuscular Tomorrow-1000 Kennedy BuckerMenz, Michael, MD      . magnesium citrate solution 1 Bottle  1 Bottle Oral Once PRN Kennedy BuckerMenz, Michael, MD      . magnesium hydroxide (MILK OF MAGNESIA) suspension 30 mL  30 mL Oral Daily PRN Kennedy BuckerMenz, Michael, MD   30 mL at 05/28/17 1141  . MEDLINE mouth rinse  15 mL Mouth Rinse BID Kennedy BuckerMenz, Michael, MD      . menthol-cetylpyridinium (CEPACOL) lozenge 3 mg  1 lozenge Oral PRN Kennedy BuckerMenz, Michael, MD       Or  . phenol (CHLORASEPTIC) mouth spray 1 spray  1  spray Mouth/Throat PRN Kennedy BuckerMenz, Michael, MD      . methocarbamol (ROBAXIN) tablet 500 mg  500 mg Oral Q6H PRN Kennedy BuckerMenz, Michael, MD   500 mg at 05/28/17 1330   Or  . methocarbamol (ROBAXIN) 500 mg in dextrose 5 % 50 mL IVPB  500 mg Intravenous Q6H PRN Kennedy BuckerMenz, Michael, MD      . metoCLOPramide (REGLAN) tablet 5-10 mg  5-10 mg Oral Q8H PRN Kennedy BuckerMenz, Michael, MD       Or  . metoCLOPramide (REGLAN) injection 5-10 mg  5-10 mg Intravenous Q8H PRN Kennedy BuckerMenz, Michael, MD      . morphine (MS CONTIN) 12 hr tablet 15 mg  15 mg Oral Q8H Kennedy BuckerMenz, Michael, MD   15 mg at 05/28/17 1428  . ondansetron (ZOFRAN) tablet 4 mg  4 mg Oral Q6H PRN Kennedy BuckerMenz, Michael, MD       Or  . ondansetron [facility](ZOFRAN) injection 4 mg  4 mg Intravenous Q6H PRN Kennedy BuckerMenz, Michael, MD      . oxyCODONE (Oxy IR/ROXICODONE) immediate release tablet 10 mg  10 mg Oral Q3H PRN Kennedy BuckerMenz, Michael, MD   10 mg at 05/28/17 1330  . oxyCODONE (Oxy IR/ROXICODONE) immediate release tablet 5 mg  5 mg Oral Q3H PRN Kennedy BuckerMenz, Michael, MD   5 mg at 05/28/17 1141  . zolpidem (AMBIEN) tablet 5 mg  5 mg Oral QHS PRN Kennedy BuckerMenz, Michael, MD         Discharge Medications: Please see discharge summary for a list of discharge medications.  Relevant Imaging Results:  Relevant Lab Results:   Additional Information (SSN: [id number])  Emma Spears, Emma CrockerBailey M, LCSW

## 2017-05-28 NOTE — Transfer of Care (Signed)
Immediate Anesthesia Transfer of Care Note  Patient: Emma Spears  Procedure(s) Performed: TOTAL HIP ARTHROPLASTY ANTERIOR APPROACH (Left )  Patient Location: PACU  Anesthesia Type:General  Level of Consciousness: awake and sedated  Airway & Oxygen Therapy: Patient Spontanous Breathing and Patient connected to face mask oxygen  Post-op Assessment: Report given to RN and Post -op Vital signs reviewed and stable  Post vital signs: Reviewed and stable  Last Vitals:  Vitals:   05/28/17 0620  BP: 139/63  Pulse: (!) 59  Resp: 16  Temp: (!) 36.1 C  SpO2: 97%    Last Pain:  Vitals:   05/28/17 0620  TempSrc: Oral  PainSc: 9          Complications: No apparent anesthesia complications

## 2017-05-28 NOTE — Anesthesia Post-op Follow-up Note (Signed)
Anesthesia QCDR form completed.        

## 2017-05-28 NOTE — Anesthesia Procedure Notes (Signed)
Procedure Name: Intubation Date/Time: 05/28/2017 7:53 AM Performed by: Nelda Marseille, CRNA Pre-anesthesia Checklist: Patient identified, Patient being monitored, Timeout performed, Emergency Drugs available and Suction available Patient Re-evaluated:Patient Re-evaluated prior to induction Oxygen Delivery Method: Circle system utilized Preoxygenation: Pre-oxygenation with 100% oxygen Induction Type: IV induction Ventilation: Mask ventilation without difficulty Laryngoscope Size: Mac, 3 and McGraph Grade View: Grade III Tube type: Oral Tube size: 7.0 mm Number of attempts: 1 Airway Equipment and Method: Stylet and Video-laryngoscopy Placement Confirmation: ETT inserted through vocal cords under direct vision,  positive ETCO2 and breath sounds checked- equal and bilateral Secured at: 21 cm Tube secured with: Tape Dental Injury: Teeth and Oropharynx as per pre-operative assessment  Difficulty Due To: Difficulty was anticipated and Difficult Airway- due to large tongue

## 2017-05-28 NOTE — Anesthesia Preprocedure Evaluation (Signed)
Anesthesia Evaluation  Patient identified by MRN, date of birth, ID band Patient awake    Reviewed: Allergy & Precautions, NPO status , Patient's Chart, lab work & pertinent test results  Airway Mallampati: III  TM Distance: >3 FB     Dental  (+) Teeth Intact, Upper Dentures   Pulmonary former smoker,    Pulmonary exam normal        Cardiovascular Normal cardiovascular exam     Neuro/Psych PSYCHIATRIC DISORDERS Anxiety Depression  Neuromuscular disease negative neurological ROS  negative psych ROS   GI/Hepatic negative GI ROS, Neg liver ROS,   Endo/Other  negative endocrine ROS  Renal/GU negative Renal ROS     Musculoskeletal  (+) Arthritis , Osteoarthritis,    Abdominal Normal abdominal exam  (+)   Peds negative pediatric ROS (+)  Hematology negative hematology ROS (+)   Anesthesia Other Findings   Reproductive/Obstetrics                             Anesthesia Physical Anesthesia Plan  ASA: II  Anesthesia Plan: General   Post-op Pain Management:    Induction: Intravenous  PONV Risk Score and Plan:   Airway Management Planned: Oral ETT  Additional Equipment:   Intra-op Plan:   Post-operative Plan: Extubation in OR  Informed Consent: I have reviewed the patients History and Physical, chart, labs and discussed the procedure including the risks, benefits and alternatives for the proposed anesthesia with the patient or authorized representative who has indicated his/her understanding and acceptance.   Dental advisory given  Plan Discussed with: CRNA and Surgeon  Anesthesia Plan Comments:         Anesthesia Quick Evaluation

## 2017-05-28 NOTE — Evaluation (Signed)
Physical Therapy Evaluation Patient Details Name: Emma Spears MRN: [id number] DOB: [birth year] Today's Date: 05/28/2017   History of Present Illness  Pt admitted for L THR.   Clinical Impression  Pt is a pleasant 61 year old female who was admitted for L THR. Pt performs bed mobility with min assist, transfers with cga, and ambulation with cga and RW. Pt demonstrates deficits with strength/pain/endurance. Pt appears very motivated to perform therapy. Would benefit from skilled PT to address above deficits and promote optimal return to PLOF. Recommend transition to HHPT upon discharge from acute hospitalization.       Follow Up Recommendations Home health PT    Equipment Recommendations  Rolling walker with 5" wheels;3in1 (PT)    Recommendations for Other Services       Precautions / Restrictions Precautions Precautions: Anterior Hip Precaution Booklet Issued: No Restrictions Weight Bearing Restrictions: Yes LLE Weight Bearing: Weight bearing as tolerated      Mobility  Bed Mobility Overal bed mobility: Needs Assistance Bed Mobility: Supine to Sit     Supine to sit: Min assist     General bed mobility comments: safe technique, follows commands well for sequencing. Once seated, able to sit with upright posture  Transfers Overall transfer level: Needs assistance Equipment used: Rolling walker (2 wheeled) Transfers: Sit to/from Stand Sit to Stand: Min guard         General transfer comment: upright posture noted with RW.  Ambulation/Gait Ambulation/Gait assistance: Min guard Ambulation Distance (Feet): 5 Feet Assistive device: Rolling walker (2 wheeled) Gait Pattern/deviations: Step-to pattern     General Gait Details: needs assist for sequencing. Slow step to gait pattern performed.  Stairs            Wheelchair Mobility    Modified Rankin (Stroke Patients Only)       Balance Overall balance assessment: Needs assistance Sitting-balance  support: Feet supported Sitting balance-Leahy Scale: Good     Standing balance support: Bilateral upper extremity supported Standing balance-Leahy Scale: Good                               Pertinent Vitals/Pain Pain Assessment: Faces Pain Score: 2  Pain Location: L hip  Pain Descriptors / Indicators: Burning Pain Intervention(s): Limited activity within patient's tolerance    Home Living Family/patient expects to be discharged to:: Private residence Living Arrangements: Spouse/significant other;Children Available Help at Discharge: Family;Available 24 hours/day Type of Home: House Home Access: Level entry     Home Layout: One level Home Equipment: Walker - 4 wheels      Prior Function Level of Independence: Independent with assistive device(s)         Comments: has rollater, used 24/7 for last few weeks due to pain     Hand Dominance        Extremity/Trunk Assessment   Upper Extremity Assessment Upper Extremity Assessment: Overall WFL for tasks assessed    Lower Extremity Assessment Lower Extremity Assessment: Generalized weakness(L LE grossly 3/5)       Communication   Communication: No difficulties  Cognition Arousal/Alertness: Awake/alert Behavior During Therapy: WFL for tasks assessed/performed Overall Cognitive Status: Within Functional Limits for tasks assessed                                        General Comments  Exercises Other Exercises Other Exercises: supine ther-ex on B LE including ankle pumps, quad sets, glut sets, and hip abd/add. All ther-ex performed x 10 reps with cga. Safe technique performed   Assessment/Plan    PT Assessment Patient needs continued PT services  PT Problem List Decreased strength;Decreased activity tolerance;Decreased balance;Decreased mobility;Decreased knowledge of use of DME;Pain       PT Treatment Interventions DME instruction;Gait training;Therapeutic exercise    PT  Goals (Current goals can be found in the Care Plan section)  Acute Rehab PT Goals Patient Stated Goal: to get stronger PT Goal Formulation: With patient Time For Goal Achievement: 06/11/17 Potential to Achieve Goals: Good    Frequency BID   Barriers to discharge        Co-evaluation               AM-PAC PT "6 Clicks" Daily Activity  Outcome Measure Difficulty turning over in bed (including adjusting bedclothes, sheets and blankets)?: Unable Difficulty moving from lying on back to sitting on the side of the bed? : Unable Difficulty sitting down on and standing up from a chair with arms (e.g., wheelchair, bedside commode, etc,.)?: Unable Help needed moving to and from a bed to chair (including a wheelchair)?: A Little Help needed walking in hospital room?: A Little Help needed climbing 3-5 steps with a railing? : A Little 6 Click Score: 12    End of Session Equipment Utilized During Treatment: Gait belt Activity Tolerance: Patient tolerated treatment well Patient left: in chair;with chair alarm set;with SCD's reapplied Nurse Communication: Mobility status PT Visit Diagnosis: Muscle weakness (generalized) (M62.81);Difficulty in walking, not elsewhere classified (R26.2);Pain Pain - Right/Left: Left Pain - part of body: Hip    Time: 1547-1610 PT Time Calculation (min) (ACUTE ONLY): 23 min   Charges:   PT Evaluation $PT Eval Low Complexity: 1 Low PT Treatments $Therapeutic Exercise: 8-22 mins   PT G Codes:   PT G-Codes **NOT FOR INPATIENT CLASS** Functional Assessment Tool Used: AM-PAC 6 Clicks Basic Mobility Functional Limitation: Mobility: Walking and moving around Mobility: Walking and Moving Around Current Status ([id number](G8978): At least 60 percent but less than 80 percent impaired, limited or restricted Mobility: Walking and Moving Around Goal Status [phone](G8979): At least 40 percent but less than 60 percent impaired, limited or restricted    Elizabeth PalauStephanie Azaleah Usman, PT,  DPT [phone]   Emma Spears 05/28/2017, 5:36 PM

## 2017-05-29 LAB — CBC
HEMATOCRIT: 30.5 % — AB (ref 35.0–47.0)
Hemoglobin: 10.2 g/dL — ABNORMAL LOW (ref 12.0–16.0)
MCH: 31 pg (ref 26.0–34.0)
MCHC: 33.4 g/dL (ref 32.0–36.0)
MCV: 92.6 fL (ref 80.0–100.0)
PLATELETS: 186 10*3/uL (ref 150–440)
RBC: 3.29 MIL/uL — ABNORMAL LOW (ref 3.80–5.20)
RDW: 13.7 % (ref 11.5–14.5)
WBC: 9.3 10*3/uL (ref 3.6–11.0)

## 2017-05-29 LAB — BASIC METABOLIC PANEL
Anion gap: 5 (ref 5–15)
BUN: 18 mg/dL (ref 6–20)
CALCIUM: 8.3 mg/dL — AB (ref 8.9–10.3)
CO2: 28 mmol/L (ref 22–32)
CREATININE: 0.89 mg/dL (ref 0.44–1.00)
Chloride: 104 mmol/L (ref 101–111)
GFR calc Af Amer: >60 mL/min (ref >60)
GLUCOSE: 115 mg/dL — AB (ref 65–99)
Potassium: 3.6 mmol/L (ref 3.5–5.1)
Sodium: 137 mmol/L (ref 135–145)

## 2017-05-29 MED ORDER — ENOXAPARIN SODIUM 40 MG/0.4ML ~~LOC~~ SOLN: 40.0000 mg | SUBCUTANEOUS | 0 refills | Status: DC

## 2017-05-29 MED ORDER — OXYCODONE HCL 5 MG PO TABS: 5.0000 mg | ORAL_TABLET | ORAL | 0 refills | Status: DC | PRN

## 2017-05-29 NOTE — Discharge Summary (Signed)
Physician Discharge Summary  Patient ID: Emma BodeVictoria R Nadeau MRN: [id number] DOB/AGE: 1955/08/25 61 y.o.  Admit date: 05/28/2017 Discharge date: 05/30/2017 Admission Diagnoses:  HIP OSTEOARTHRITIS LEFT   Discharge Diagnoses: Patient Active Problem List   Diagnosis Date Noted  . Primary localized osteoarthritis of left hip 05/28/2017    Past Medical History:  Diagnosis Date  . Anxiety   . Arthritis   . Depression      Transfusion: none   Consultants (if any):   Discharged Condition: Improved  Hospital Course: Emma Spears is an 61 y.o. female who was admitted 05/28/2017 with a diagnosis of left hip osteoarthritis and went to the operating room on 05/28/2017 and underwent the above named procedures.    Surgeries: Procedure(s): TOTAL HIP ARTHROPLASTY ANTERIOR APPROACH on 05/28/2017 Patient tolerated the surgery well. Taken to PACU where she was stabilized and then transferred to the orthopedic floor.  Started on Lovenox 40 mgq 24 hrs. Foot pumps applied bilaterally at 80 mm. Heels elevated on bed with rolled towels. No evidence of DVT. Negative Homan. Physical therapy started on day #1 for gait training and transfer. OT started day #1 for ADL and assisted devices.  Patient's foley was d/c on day #1. Patient's IV  was d/c on day #2.  On post op day #2 patient was stable and ready for discharge to home with HHPT.  Implants: Medacta AMIS 3 standard stem, 56 mm  Mpact DM cup and liner with S metal 28 mm head    She was given perioperative antibiotics:  Anti-infectives (From admission, onward)   Start     Dose/Rate Route Frequency Ordered Stop   05/28/17 1400  ceFAZolin (ANCEF) 2 g in dextrose 5 % 100 mL IVPB     2 g 200 mL/hr over 30 Minutes Intravenous Every 6 hours 05/28/17 1127 05/29/17 0259   05/28/17 1130  ceFAZolin (ANCEF) IVPB 2g/100 mL premix  Status:  Discontinued     2 g 200 mL/hr over 30 Minutes Intravenous Every 6 hours 05/28/17 1122 05/28/17 1127    05/28/17 0130  ceFAZolin (ANCEF) IVPB 2g/100 mL premix     2 g 200 mL/hr over 30 Minutes Intravenous  Once 05/28/17 0119 05/28/17 0743    .  She was given sequential compression devices, early ambulation, and Lovenox for DVT prophylaxis.  She benefited maximally from the hospital stay and there were no complications.    Recent vital signs:  Vitals:   05/29/17 1116 05/29/17 1457  BP: (!) 118/98 (!) 101/52  Pulse: 71 69  Resp: 14 16  Temp:  99 F (37.2 C)  SpO2: 91% 97%    Recent laboratory studies:  Lab Results  Component Value Date   HGB 10.2 (L) 05/29/2017   HGB 12.0 05/28/2017   HGB 13.9 05/27/2017   Lab Results  Component Value Date   WBC 9.3 05/29/2017   PLT 186 05/29/2017   Lab Results  Component Value Date   INR 1.01 05/27/2017   Lab Results  Component Value Date   NA 137 05/29/2017   K 3.6 05/29/2017   CL 104 05/29/2017   CO2 28 05/29/2017   BUN 18 05/29/2017   CREATININE 0.89 05/29/2017   GLUCOSE 115 (H) 05/29/2017    Discharge Medications:   Allergies as of 05/29/2017      Reactions   Vicodin [hydrocodone-acetaminophen] Nausea And Vomiting      Medication List    TAKE these medications   enoxaparin 40 MG/0.4ML injection Commonly known as:  LOVENOX Inject 0.4 mLs (40 mg total) into the skin daily for 14 days. Start taking on:  05/30/2017   escitalopram 20 MG tablet Commonly known as:  LEXAPRO Take 20 mg by mouth daily.   gabapentin 300 MG capsule Commonly known as:  NEURONTIN Take 900 mg by mouth 3 (three) times daily.   indomethacin 25 MG capsule Commonly known as:  INDOCIN Take 25 mg by mouth 2 (two) times daily with a meal.   morphine 15 MG 12 hr tablet Commonly known as:  MS CONTIN Take 1 tablet by mouth 3 (three) times daily.   oxyCODONE 5 MG immediate release tablet Commonly known as:  Oxy IR/ROXICODONE Take 1-2 tablets (5-10 mg total) by mouth every 4 (four) hours as needed for moderate pain ((score 4 to 6)). What  changed:    how much to take  reasons to take this            Durable Medical Equipment  (From admission, onward)        Start     Ordered   05/28/17 1123  DME 3 n 1  Once     05/28/17 1122   05/28/17 1123  DME Walker rolling  Once    Question:  Patient needs a walker to treat with the following condition  Answer:  Status post total hip replacement, left   05/28/17 1122   05/28/17 1123  DME Bedside commode  Once    Question:  Patient needs a bedside commode to treat with the following condition  Answer:  Status post total hip replacement, left   05/28/17 1122      Diagnostic Studies: Dg Hip Operative Unilat W Or W/o Pelvis Left  Result Date: 05/28/2017 CLINICAL DATA:  Status post left hip arthroplasty. EXAM: OPERATIVE left HIP (WITH PELVIS IF PERFORMED) 8 VIEWS TECHNIQUE: Fluoroscopic spot image(s) were submitted for interpretation post-operatively. Radiation exposure index:  3.7 mGy. COMPARISON:  None. FINDINGS: Eight intraoperative fluoroscopic images were obtained of the left hip. These demonstrate the patient be status post left hip arthroplasty. The femoral and acetabular components appear to be well situated. IMPRESSION: Status post left hip arthroplasty. Electronically Signed   By: Lupita RaiderJames  Green Jr, M.D.   On: 05/28/2017 09:20   Dg Hip Unilat W Or W/o Pelvis 2-3 Views Left  Result Date: 05/28/2017 CLINICAL DATA:  Status post left total hip joint prosthesis placement. EXAM: DG HIP (WITH OR WITHOUT PELVIS) 2-3V LEFT COMPARISON:  Fluoro spot series of today's date FINDINGS: The patient has undergone left total hip joint prosthesis placement. Radiographic positioning of the prosthetic components is good. The interface with the native bone is normal. A wound VAC and skin staples are present. IMPRESSION: No immediate postprocedure complication following left hip joint prosthesis placement. Electronically Signed   By: David  SwazilandJordan M.D.   On: 05/28/2017 10:24    Disposition:      Follow-up Information    Kennedy BuckerMenz, Michael, MD Follow up in 2 week(s).   Specialty:  Orthopedic Surgery Contact information: [address] [facility] [place] [address] [phone]            Signed: Patience MuscaGAINES, Daxtyn Rottenberg CHRISTOPHER 05/29/2017, 3:59 PM

## 2017-05-29 NOTE — Progress Notes (Addendum)
Physical Therapy Treatment Patient Details Name: Emma BodeVictoria R Henly MRN: [id number] DOB: July 31, 1955 Today's Date: 05/29/2017    History of Present Illness Pt admitted for L THR.     PT Comments    Pt is making slow progress towards goals. Due to previous history of back surgeries, complaining of increased back pain this date from sleeping in the bed. Good endurance with there-ex and pt continues to be motivated. Needs to progress ambulation distance for home. Educated pt on placement of chairs in home due to low endurance secondary to back pain. Will continue to progress. No complaints of dizziness at this time.  Follow Up Recommendations  Home health PT     Equipment Recommendations  Rolling walker with 5" wheels;3in1 (PT)    Recommendations for Other Services       Precautions / Restrictions Precautions Precautions: Anterior Hip Precaution Booklet Issued: Yes (comment) Restrictions Weight Bearing Restrictions: Yes LLE Weight Bearing: Weight bearing as tolerated    Mobility  Bed Mobility               General bed mobility comments: not performed as pt received in recliner  Transfers Overall transfer level: Needs assistance Equipment used: Rolling walker (2 wheeled) Transfers: Sit to/from Stand Sit to Stand: Min guard         General transfer comment: forward flexed posture noted with RW. Pt reports hip pain is improved with standing.  Ambulation/Gait Ambulation/Gait assistance: Min guard Ambulation Distance (Feet): 20 Feet Assistive device: Rolling walker (2 wheeled) Gait Pattern/deviations: Step-to pattern     General Gait Details: takes multiple rest breaks, however no seated rest breaks. Slow step to gait pattern performed.   Stairs            Wheelchair Mobility    Modified Rankin (Stroke Patients Only)       Balance                                            Cognition Arousal/Alertness: Awake/alert Behavior  During Therapy: WFL for tasks assessed/performed Overall Cognitive Status: Within Functional Limits for tasks assessed                                        Exercises Other Exercises Other Exercises: supine ther-ex on B LE including ankle pumps, quad sets, glut sets, SAQ, and hip ab/add. All ther-ex performed x 12 reps with cga    General Comments        Pertinent Vitals/Pain Pain Assessment: 0-10 Pain Score: 7  Pain Location: L hip  Pain Descriptors / Indicators: Burning;Operative site guarding Pain Intervention(s): Limited activity within patient's tolerance;Ice applied    Home Living                      Prior Function            PT Goals (current goals can now be found in the care plan section) Acute Rehab PT Goals Patient Stated Goal: to get stronger PT Goal Formulation: With patient Time For Goal Achievement: 06/11/17 Potential to Achieve Goals: Good Progress towards PT goals: Progressing toward goals    Frequency    BID      PT Plan Current plan remains appropriate    Co-evaluation  AM-PAC PT "6 Clicks" Daily Activity  Outcome Measure  Difficulty turning over in bed (including adjusting bedclothes, sheets and blankets)?: Unable Difficulty moving from lying on back to sitting on the side of the bed? : Unable Difficulty sitting down on and standing up from a chair with arms (e.g., wheelchair, bedside commode, etc,.)?: Unable Help needed moving to and from a bed to chair (including a wheelchair)?: A Little Help needed walking in hospital room?: A Little Help needed climbing 3-5 steps with a railing? : A Lot 6 Click Score: 11    End of Session Equipment Utilized During Treatment: Gait belt Activity Tolerance: Patient limited by pain Patient left: in chair;with chair alarm set;with SCD's reapplied Nurse Communication: Mobility status PT Visit Diagnosis: Muscle weakness (generalized) (M62.81);Difficulty in  walking, not elsewhere classified (R26.2);Pain Pain - Right/Left: Left Pain - part of body: Hip     Time: [id number] PT Time Calculation (min) (ACUTE ONLY): 38 min  Charges:  $Gait Training: 23-37 mins $Therapeutic Exercise: 8-22 mins                    G Codes:  Functional Assessment Tool Used: AM-PAC 6 Clicks Basic Mobility Functional Limitation: Mobility: Walking and moving around Mobility: Walking and Moving Around Current Status ([id number]([id number]): At least 60 percent but less than 80 percent impaired, limited or restricted Mobility: Walking and Moving Around Goal Status [phone]([id number]): At least 40 percent but less than 60 percent impaired, limited or restricted    Elizabeth PalauStephanie Resa Rinks, PT, DPT [phone]    Coye Dawood 05/29/2017, 10:54 AM

## 2017-05-29 NOTE — Progress Notes (Signed)
   Subjective: 1 Day Post-Op Procedure(s) (LRB): TOTAL HIP ARTHROPLASTY ANTERIOR APPROACH (Left) Patient reports pain as mild.   Patient is well, and has had no acute complaints or problems Denies any CP, SOB, ABD pain. We will continue therapy today.  Plan is to go Home after hospital stay.  Objective: Vital signs in last 24 hours: Temp:  [97.4 F (36.3 C)-101.7 F (38.7 C)] 99.5 F (37.5 C) (12/21 0535) Pulse Rate:  [75-96] 75 (12/21 0535) Resp:  [12-25] 18 (12/21 0535) BP: (91-166)/(44-98) 102/48 (12/21 0535) SpO2:  [89 %-100 %] 97 % (12/21 0535) Weight:  [93.4 kg (206 lb)] 93.4 kg (206 lb) (12/20 1426)  Intake/Output from previous day: 12/20 0701 - 12/21 0700 In: 2800 [P.O.:1080; I.V.:1520; IV Piggyback:200] Out: 3250 [Urine:2250; Blood:1000] Intake/Output this shift: No intake/output data recorded.  Recent Labs    05/27/17 1437 05/28/17 1157  HGB 13.9 12.0   Recent Labs    05/27/17 1437 05/28/17 1157  WBC 5.9 12.1*  RBC 4.43 3.86  HCT 41.1 36.2  PLT 232 192   Recent Labs    05/27/17 1437 05/28/17 1157  NA 140  --   K 3.7  --   CL 104  --   CO2 30  --   BUN 21*  --   CREATININE 0.72 0.72  GLUCOSE 92  --   CALCIUM 9.3  --    Recent Labs    05/27/17 1437  INR 1.01    EXAM General - Patient is Alert, Appropriate and Oriented Extremity - Neurovascular intact Sensation intact distally Intact pulses distally Dorsiflexion/Plantar flexion intact No cellulitis present Compartment soft Dressing - dressing C/D/I and no drainage, wound vac intact Motor Function - intact, moving foot and toes well on exam.   Past Medical History:  Diagnosis Date  . Anxiety   . Arthritis   . Depression     Assessment/Plan:   1 Day Post-Op Procedure(s) (LRB): TOTAL HIP ARTHROPLASTY ANTERIOR APPROACH (Left) Active Problems:   Primary localized osteoarthritis of left hip  Estimated body mass index is 37.68 kg/m as calculated from the following:   Height as  of this encounter: 5\' 2"  (1.575 m).   Weight as of this encounter: 93.4 kg (206 lb). Advance diet Up with therapy  Needs BM Encouraged incentive spirometer Recheck labs in the am Try to limit narcotics, watch for any confusion CM to assist with discharge to home with HHPT   DVT Prophylaxis - Lovenox, Foot Pumps and TED hose Weight-Bearing as tolerated to left leg   T. Cranston Neighborhris Gaines, PA-C [facility] 05/29/2017, 7:49 AM

## 2017-05-29 NOTE — Progress Notes (Signed)
Clinical Social Worker (CSW) received SNF consult. PT is recommending home health. RN case manager aware of above. Please reconsult if future social work needs arise. CSW signing off.   Daviyon Widmayer, LCSW [phone] 

## 2017-05-29 NOTE — Care Management Note (Signed)
Case Management Note  Patient Details  Name: GENITA NILSSON MRN: [id number] Date of Birth: 1956-03-13  Subjective/Objective:   POD # 1 left THA. Met with patient at bedside. She is sitting up in chair. Alert and oriented. She lives at home with her husband and 2 grandsons 38 and 44. Has a Rolator. Will need a walker and BSC. [facility]dered from [facility]. Offered choice of home health agencies. [facility] to [facility] for HHPT. [facility]acy : [facility] [address] [phone]. Called Lovenox 40 mg # 14                   Action/Plan: [facility] for HHPT, Walker and BSC. Lovenox called in  Expected Discharge Date:  05/31/17               Expected Discharge Plan:  [place]  In-House Referral:     Discharge planning Services  CM Consult  Post Acute Care Choice:  Durable Medical Equipment, Home Health Choice offered to:  Patient  DME Arranged:  Bedside commode DME Agency:  [place]:  PT [facility] Agency:  [place]  Status of Service:  In process, will continue to follow  If discussed at Long Length of Stay Meetings, dates discussed:    Additional Comments:  Jolly Mango, RN 05/29/2017, 10:07 AM

## 2017-05-29 NOTE — Progress Notes (Signed)
Physical Therapy Treatment Patient Details Name: Emma Spears MRN: [id number] DOB: 03-17-1956 Today's Date: 05/29/2017    History of Present Illness Pt admitted for L THR.     PT Comments    Pt is making good progress towards goals with increased ambulation distance this session. Used +2 for chair follow secondary to decreased endurance, however no rest break required. Still ambulates with short step length and very slow speed. Good endurance with HEP. Pt very motivated to return home next date. No stairs to enter home. Will continue to progress.   Follow Up Recommendations  Home health PT     Equipment Recommendations  Rolling walker with 5" wheels;3in1 (PT)    Recommendations for Other Services       Precautions / Restrictions Precautions Precautions: Anterior Hip Precaution Booklet Issued: Yes (comment) Restrictions Weight Bearing Restrictions: Yes LLE Weight Bearing: Weight bearing as tolerated    Mobility  Bed Mobility Overal bed mobility: Needs Assistance Bed Mobility: Sit to Supine       Sit to supine: Min assist;+2 for physical assistance   General bed mobility comments: needs assist for scooting back into bed with +2 assist for scooting up towards HOB.   Transfers Overall transfer level: Needs assistance Equipment used: Rolling walker (2 wheeled) Transfers: Sit to/from Stand Sit to Stand: Min guard         General transfer comment: improved posture this date, slightly forward flexed posture. Used RW for transfer  Ambulation/Gait Ambulation/Gait assistance: Hydrographic surveyorMin guard Ambulation Distance (Feet): 200 Feet Assistive device: Rolling walker (2 wheeled) Gait Pattern/deviations: Step-to pattern     General Gait Details: improved endurance with RW, no rest breaks required. Slow step to gait pattern noted. Becomes more flexed with increased distance.   Stairs            Wheelchair Mobility    Modified Rankin (Stroke Patients Only)        Balance                                            Cognition Arousal/Alertness: (sleepy) Behavior During Therapy: WFL for tasks assessed/performed Overall Cognitive Status: Within Functional Limits for tasks assessed                                        Exercises Other Exercises Other Exercises: supine ther-ex on B LE including ankle pumps, quad sets, glut sets, SLR, SAQ, and hip ab/add. All ther-ex performed x 12 reps with cga    General Comments        Pertinent Vitals/Pain Pain Assessment: Faces Faces Pain Scale: Hurts little more Pain Location: L hip  Pain Descriptors / Indicators: Burning;Operative site guarding Pain Intervention(s): Limited activity within patient's tolerance    Home Living                      Prior Function            PT Goals (current goals can now be found in the care plan section) Acute Rehab PT Goals Patient Stated Goal: to get stronger PT Goal Formulation: With patient Time For Goal Achievement: 06/11/17 Potential to Achieve Goals: Good Progress towards PT goals: Progressing toward goals    Frequency    BID  PT Plan Current plan remains appropriate    Co-evaluation              AM-PAC PT "6 Clicks" Daily Activity  Outcome Measure  Difficulty turning over in bed (including adjusting bedclothes, sheets and blankets)?: Unable Difficulty moving from lying on back to sitting on the side of the bed? : Unable Difficulty sitting down on and standing up from a chair with arms (e.g., wheelchair, bedside commode, etc,.)?: Unable Help needed moving to and from a bed to chair (including a wheelchair)?: A Little Help needed walking in hospital room?: A Little Help needed climbing 3-5 steps with a railing? : A Lot 6 Click Score: 11    End of Session Equipment Utilized During Treatment: Gait belt Activity Tolerance: Patient tolerated treatment well Patient left: in bed;with bed  alarm set;with SCD's reapplied Nurse Communication: Mobility status PT Visit Diagnosis: Muscle weakness (generalized) (M62.81);Difficulty in walking, not elsewhere classified (R26.2);Pain Pain - Right/Left: Left Pain - part of body: Hip     Time: [id number] PT Time Calculation (min) (ACUTE ONLY): 29 min  Charges:  $Gait Training: 8-22 mins $Therapeutic Exercise: 8-22 mins                    G Codes:  Functional Assessment Tool Used: AM-PAC 6 Clicks Basic Mobility Functional Limitation: Mobility: Walking and moving around Mobility: Walking and Moving Around Current Status (V4098(G8978): At least 60 percent but less than 80 percent impaired, limited or restricted Mobility: Walking and Moving Around Goal Status [phone]([phone]): At least 40 percent but less than 60 percent impaired, limited or restricted    Elizabeth PalauStephanie Aydyn Testerman, PT, DPT [phone]    Emma Spears 05/29/2017, 2:57 PM

## 2017-05-29 NOTE — Discharge Instructions (Signed)

## 2017-05-30 LAB — BASIC METABOLIC PANEL
Anion gap: 4 — ABNORMAL LOW (ref 5–15)
BUN: 11 mg/dL (ref 6–20)
CHLORIDE: 103 mmol/L (ref 101–111)
CO2: 29 mmol/L (ref 22–32)
Calcium: 8.4 mg/dL — ABNORMAL LOW (ref 8.9–10.3)
Creatinine, Ser: 0.71 mg/dL (ref 0.44–1.00)
GFR calc Af Amer: >60 mL/min (ref >60)
GFR calc non Af Amer: >60 mL/min (ref >60)
GLUCOSE: 136 mg/dL — AB (ref 65–99)
POTASSIUM: 3.7 mmol/L (ref 3.5–5.1)
Sodium: 136 mmol/L (ref 135–145)

## 2017-05-30 LAB — CBC
HEMATOCRIT: 29.4 % — AB (ref 35.0–47.0)
HEMOGLOBIN: 9.8 g/dL — AB (ref 12.0–16.0)
MCH: 31.4 pg (ref 26.0–34.0)
MCHC: 33.5 g/dL (ref 32.0–36.0)
MCV: 93.9 fL (ref 80.0–100.0)
PLATELETS: 169 10*3/uL (ref 150–440)
RBC: 3.13 MIL/uL — AB (ref 3.80–5.20)
RDW: 13.9 % (ref 11.5–14.5)
WBC: 8.1 10*3/uL (ref 3.6–11.0)

## 2017-05-30 NOTE — Progress Notes (Signed)
Subjective: 2 Days Post-Op Procedure(s) (LRB): TOTAL HIP ARTHROPLASTY ANTERIOR APPROACH (Left) Patient reports pain as moderate.   Patient is well, and has had no acute complaints or problems Denies any CP, SOB, ABD pain. We will continue therapy today.  Plan is to go Home after hospital stay.  Objective: Vital signs in last 24 hours: Temp:  [98.3 F (36.8 C)-99.1 F (37.3 C)] 98.3 F (36.8 C) (12/22 0814) Pulse Rate:  [69-76] 76 (12/22 0814) Resp:  [14-18] 18 (12/22 0814) BP: (101-135)/(52-98) 119/68 (12/22 0814) SpO2:  [91 %-97 %] 93 % (12/22 0814)  Intake/Output from previous day: 12/21 0701 - 12/22 0700 In: 3243.3 [P.O.:840; I.V.:2403.3] Out: 1 [Urine:1] Intake/Output this shift: No intake/output data recorded.  Recent Labs    05/27/17 1437 05/28/17 1157 05/29/17 0737 05/30/17 0435  HGB 13.9 12.0 10.2* 9.8*   Recent Labs    05/29/17 0737 05/30/17 0435  WBC 9.3 8.1  RBC 3.29* 3.13*  HCT 30.5* 29.4*  PLT 186 169   Recent Labs    05/29/17 0737 05/30/17 0435  NA 137 136  K 3.6 3.7  CL 104 103  CO2 28 29  BUN 18 11  CREATININE 0.89 0.71  GLUCOSE 115* 136*  CALCIUM 8.3* 8.4*   Recent Labs    05/27/17 1437  INR 1.01    EXAM General - Patient is Alert, Appropriate and Oriented Extremity - Neurovascular intact Sensation intact distally Intact pulses distally Dorsiflexion/Plantar flexion intact No cellulitis present Compartment soft Dressing - dressing C/D/I and no drainage, wound vac intact Motor Function - intact, moving foot and toes well on exam.   Past Medical History:  Diagnosis Date  . Anxiety   . Arthritis   . Depression     Assessment/Plan:   2 Days Post-Op Procedure(s) (LRB): TOTAL HIP ARTHROPLASTY ANTERIOR APPROACH (Left) Active Problems:   Primary localized osteoarthritis of left hip  Estimated body mass index is 37.68 kg/m as calculated from the following:   Height as of this encounter: 5\' 2"  (1.575 m).   Weight as of  this encounter: 93.4 kg (206 lb). Advance diet Up with therapy   Labs reviewed this AM. Pt has had two bowel movements since surgery. Prior to discharge home today, switch woundvac to a Provena woundvac. Plan will be for discharge home today with Oxycontin that she was taking before and oxycodone prn for breakthrough pain.  DVT Prophylaxis - Lovenox, Foot Pumps and TED hose Weight-Bearing as tolerated to left leg  J. Horris LatinoLance Oliva Montecalvo, PA-C [facility] 05/30/2017, 9:12 AM

## 2017-05-30 NOTE — Progress Notes (Signed)
DISCHARGE NOTE:  Pt given discharge instructions and prescriptions. Pt verbalized understanding. Pt wheeled to car by staff.  

## 2017-05-30 NOTE — Care Management Note (Signed)
Case Management Note  Patient Details  Name: Chana BodeVictoria R Omar MRN: [id number] Date of Birth: 1955-11-07  Subjective/Objective:     [facility], RN. Has RW and BSC at bedside. Lovenox called in.                Action/Plan:   Expected Discharge Date:  05/30/17               Expected Discharge Plan:  Home w Home Health Services  In-House Referral:     Discharge planning Services  CM Consult  Post Acute Care Choice:  Durable Medical Equipment, Home Health Choice offered to:  Patient  DME Arranged:  3-N-1 DME Agency:  [facility]  HH [facility]:  [facility]  [facility]  Status of Service:  Completed, signed off  If discussed at Long Length of Stay Meetings, dates discussed:    Additional Comments:  Vaani Morren A, RN 05/30/2017, 10:47 AM

## 2017-05-30 NOTE — Progress Notes (Signed)
Physical Therapy Treatment Patient Details Name: ANAROSA KUBISIAK MRN: [id number] DOB: 22-Feb-1956 Today's Date: 05/30/2017    History of Present Illness Pt admitted for L THR.     PT Comments    Pt is making good progress and safe to dc home this date. Pt reports increased pain this date, however appears more related to back rather than hip. Pt able to tolerate all hip there-ex with minimal assistance. Pt still motivated to perform therapy and has met goals.    Follow Up Recommendations  Home health PT     Equipment Recommendations  Rolling walker with 5" wheels;3in1 (PT)    Recommendations for Other Services       Precautions / Restrictions Precautions Precautions: Anterior Hip Precaution Booklet Issued: Yes (comment) Restrictions Weight Bearing Restrictions: Yes LLE Weight Bearing: Weight bearing as tolerated    Mobility  Bed Mobility Overal bed mobility: Needs Assistance Bed Mobility: Supine to Sit     Supine to sit: Min guard     General bed mobility comments: safe technique with limited cues for sequencing. Once seated, upright posture noted  Transfers Overall transfer level: Needs assistance Equipment used: Rolling walker (2 wheeled) Transfers: Sit to/from Stand Sit to Stand: Min guard         General transfer comment: uses RW safely and pushes from seated surface, still forward flexed posture  Ambulation/Gait Ambulation/Gait assistance: Min guard Ambulation Distance (Feet): 50 Feet Assistive device: Rolling walker (2 wheeled) Gait Pattern/deviations: Step-to pattern     General Gait Details: Having increased pain, decreased distance this session. Improved functional independence with less cues for RW guidance and obstacle avoidance. Slow speed   Stairs            Wheelchair Mobility    Modified Rankin (Stroke Patients Only)       Balance                                            Cognition Arousal/Alertness:  Awake/alert Behavior During Therapy: WFL for tasks assessed/performed Overall Cognitive Status: Within Functional Limits for tasks assessed                                        Exercises Other Exercises Other Exercises: seated ther-ex performed on L LE including ankle pumps, quad sets, SLRs, hip abd/add, LAQ, SAQ, and glut sets. All ther-ex performed x 12 reps with cga. Safe technique noted. Other Exercises: Ambulated to bathroom, needs cga for set up and cues for transfer. Able to perform self hygiene appropriately.    General Comments        Pertinent Vitals/Pain Pain Assessment: 0-10 Pain Score: 6  Pain Location: L hip  Pain Descriptors / Indicators: Burning;Operative site guarding Pain Intervention(s): Limited activity within patient's tolerance    Home Living                      Prior Function            PT Goals (current goals can now be found in the care plan section) Acute Rehab PT Goals Patient Stated Goal: to get stronger PT Goal Formulation: With patient Time For Goal Achievement: 06/11/17 Potential to Achieve Goals: Good Progress towards PT goals: Progressing toward goals    Frequency  BID      PT Plan Current plan remains appropriate    Co-evaluation              AM-PAC PT "6 Clicks" Daily Activity  Outcome Measure  Difficulty turning over in bed (including adjusting bedclothes, sheets and blankets)?: Unable Difficulty moving from lying on back to sitting on the side of the bed? : Unable Difficulty sitting down on and standing up from a chair with arms (e.g., wheelchair, bedside commode, etc,.)?: Unable Help needed moving to and from a bed to chair (including a wheelchair)?: A Little Help needed walking in hospital room?: A Little Help needed climbing 3-5 steps with a railing? : A Lot 6 Click Score: 11    End of Session Equipment Utilized During Treatment: Gait belt Activity Tolerance: Patient tolerated  treatment well Patient left: in chair;with chair alarm set Nurse Communication: Mobility status PT Visit Diagnosis: Muscle weakness (generalized) (M62.81);Difficulty in walking, not elsewhere classified (R26.2);Pain Pain - Right/Left: Left Pain - part of body: Hip     Time: 4403-4742 PT Time Calculation (min) (ACUTE ONLY): 39 min  Charges:  $Gait Training: 8-22 mins $Therapeutic Exercise: 8-22 mins $Therapeutic Activity: 8-22 mins                    G Codes:  Functional Assessment Tool Used: AM-PAC 6 Clicks Basic Mobility Functional Limitation: Mobility: Walking and moving around Mobility: Walking and Moving Around Current Status (V9563): At least 60 percent but less than 80 percent impaired, limited or restricted Mobility: Walking and Moving Around Goal Status [phone]): At least 40 percent but less than 60 percent impaired, limited or restricted    Greggory Stallion, PT, DPT [phone]    Alka Falwell 05/30/2017, 11:02 AM

## 2017-06-01 LAB — SURGICAL PATHOLOGY

## 2017-08-20 ENCOUNTER — Inpatient Hospital Stay: Admission: RE | Admit: 2017-08-20 | Payer: Medicare Other | Source: Ambulatory Visit

## 2017-08-25 ENCOUNTER — Other Ambulatory Visit: Payer: Self-pay

## 2017-08-25 ENCOUNTER — Encounter
Admission: RE | Admit: 2017-08-25 | Discharge: 2017-08-25 | Disposition: A | Discharge status: Home / Self Care | Payer: Medicare Other | Source: Ambulatory Visit | Attending: Orthopedic Surgery | Admitting: Orthopedic Surgery

## 2017-08-25 DIAGNOSIS — Z01812 Encounter for preprocedural laboratory examination: Secondary | ICD-10-CM | POA: Diagnosis present

## 2017-08-25 DIAGNOSIS — M1612 Unilateral primary osteoarthritis, left hip: Secondary | ICD-10-CM | POA: Diagnosis not present

## 2017-08-25 LAB — URINALYSIS, ROUTINE W REFLEX MICROSCOPIC
BILIRUBIN URINE: NEGATIVE
Bacteria, UA: NONE SEEN
GLUCOSE, UA: NEGATIVE mg/dL
KETONES UR: NEGATIVE mg/dL
Nitrite: NEGATIVE
PH: 5 (ref 5.0–8.0)
Protein, ur: NEGATIVE mg/dL
Specific Gravity, Urine: 1.021 (ref 1.005–1.030)

## 2017-08-25 LAB — TYPE AND SCREEN
ABO/RH(D): O POS
Antibody Screen: NEGATIVE

## 2017-08-25 LAB — CBC WITH DIFFERENTIAL/PLATELET
Basophils Absolute: 0.1 10*3/uL (ref 0–0.1)
Basophils Relative: 1 %
Eosinophils Absolute: 0.1 10*3/uL (ref 0–0.7)
Eosinophils Relative: 1 %
HCT: 42.9 % (ref 35.0–47.0)
Hemoglobin: 14.2 g/dL (ref 12.0–16.0)
LYMPHS ABS: 2 10*3/uL (ref 1.0–3.6)
LYMPHS PCT: 24 %
MCH: 29.9 pg (ref 26.0–34.0)
MCHC: 33.1 g/dL (ref 32.0–36.0)
MCV: 90.3 fL (ref 80.0–100.0)
MONO ABS: 0.5 10*3/uL (ref 0.2–0.9)
MONOS PCT: 6 %
Neutro Abs: 5.7 10*3/uL (ref 1.4–6.5)
Neutrophils Relative %: 68 %
Platelets: 276 10*3/uL (ref 150–440)
RBC: 4.76 MIL/uL (ref 3.80–5.20)
RDW: 14.5 % (ref 11.5–14.5)
WBC: 8.3 10*3/uL (ref 3.6–11.0)

## 2017-08-25 LAB — BASIC METABOLIC PANEL
Anion gap: 9 (ref 5–15)
BUN: 17 mg/dL (ref 6–20)
CO2: 28 mmol/L (ref 22–32)
CREATININE: 0.62 mg/dL (ref 0.44–1.00)
Calcium: 9.4 mg/dL (ref 8.9–10.3)
Chloride: 106 mmol/L (ref 101–111)
GFR calc Af Amer: >60 mL/min (ref >60)
GLUCOSE: 83 mg/dL (ref 65–99)
POTASSIUM: 3.3 mmol/L — AB (ref 3.5–5.1)
Sodium: 143 mmol/L (ref 135–145)

## 2017-08-25 LAB — SURGICAL PCR SCREEN
MRSA, PCR: NEGATIVE
Staphylococcus aureus: NEGATIVE

## 2017-08-25 LAB — SEDIMENTATION RATE: SED RATE: 10 mm/h (ref 0–30)

## 2017-08-25 LAB — APTT: aPTT: 32 seconds (ref 24–36)

## 2017-08-25 LAB — PROTIME-INR
INR: 1.02
Prothrombin Time: 13.3 seconds (ref 11.4–15.2)

## 2017-08-25 NOTE — Pre-Procedure Instructions (Signed)
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  One of her daughters will be there to help her out after surgery.  Husband is also positive for MRSA so I reviewed handwashing importance for her at all times.

## 2017-08-25 NOTE — Patient Instructions (Signed)
Your procedure is scheduled on: Thursday, MARCH 28  Report to THE SECOND FLOOR OF THE MEDICAL MALL  To find out your arrival time please call [phone]  between 1PM - 3PM on Wednesday, MARCH 27  Remember: Instructions that are not followed completely may result in serious  medical risk, up to and including death, or upon the discretion of your surgeon  and anesthesiologist your surgery may need to be rescheduled.     _X__ 1. Do not eat food after midnight the night before your procedure.                 No gum chewing or hard candies.                 You may drink clear liquids up to 2 hours                 before you are scheduled to arrive for your surgery-                  DO not drink clear liquids within 2 hours of the start of your surgery.                  Clear Liquids include:  water, apple juice without pulp, clear carbohydrate                 drink such as Clearfast of Gartorade, Black Coffee or Tea (Do not add                 anything to coffee or tea).  __X__2.  On the morning of surgery brush your teeth with toothpaste and water,                         you may rinse your mouth with mouthwash if you wish.                              Do not swallow any toothpaste of mouthwash.     _X__ 3.  No Alcohol for 24 hours before or after surgery.   _X__ 4.  Do Not Smoke or use e-cigarettes For 24 Hours Prior to Your Surgery.                 Do not use any chewable tobacco products for at least 6 hours prior to                 surgery.  ____  5.  Bring all medications with you on the day of surgery if instructed.   __X__  6.  Notify your doctor if there is any change in your medical condition      (cold, fever, infections).     Do not wear jewelry, make-up, hairpins, clips or nail polish. Do not wear lotions, powders, or perfumes. You may wear deodorant. Do not shave 48 hours prior to surgery. Men may shave face and neck. Do not bring valuables  to the hospital.    [facility] is not responsible for any belongings or valuables.  Contacts, dentures or bridgework may not be worn into surgery. Leave your suitcase in the car. After surgery it may be brought to your room. For patients admitted to the hospital, discharge time is determined by your treatment team.   Patients discharged the day of surgery will not be allowed to drive home.   Please  read over the following fact sheets that you were given:   PREPARING FOR SURGERY   ____ Take these medicines the morning of surgery with A SIP OF WATER:    1. LEXAPRO  2. NEURONTIN  3. MS CONTIN  4. OXYCODONE, AS NEEDED  5. CLARITIN AS NEEDED  6.  ____ Fleet Enema (as directed)   _X___ Use CHG Soap as directed  ____ Use inhalers on the day of surgery  ____ Stop ALL ASPIRIN PRODUCTS AS OF MARCH 21             THIS INCLUDES EXCEDRIN / BC POWDERS  ____ Stop Anti-inflammatories AS OF MARCH 21             THIS INCLUDES IBUPROFEN / MOTRIN /ADVIL / ALEVE /NAPROSYN /                     AND INDOCIN  YOU MAY TAKE TYLENOL IF NEED BE   __X__ Stop supplements until after surgery.  AS OF MARCH 21  ____ Bring C-Pap to the hospital.   BRING YOUR PAIN STIMULATOR REMOTE WITH YOU  HAVE COMFORTABLE CLOTHES TO WEAR HOME  HAVE STABLE SLIP ON SHOES  STOOL SOFTENERS IF NEED FOR CONSTIPATION

## 2017-08-26 LAB — URINE CULTURE: Culture: NO GROWTH

## 2017-08-26 NOTE — Pre-Procedure Instructions (Signed)
LABS WITH KT 3.3 NEEDS SUPP START[facility]

## 2017-09-02 MED ORDER — OXYMETAZOLINE HCL 0.05 % NA SOLN
NASAL | Status: AC
  Filled 2017-09-02: qty 15

## 2017-09-02 MED ORDER — TRANEXAMIC ACID 1000 MG/10ML IV SOLN
1000.0000 mg | INTRAVENOUS | Status: AC
  Administered 2017-09-03: 1000 mg via INTRAVENOUS
  Filled 2017-09-02: qty 10

## 2017-09-02 MED ORDER — CEFAZOLIN SODIUM-DEXTROSE 2-4 GM/100ML-% IV SOLN
2.0000 g | Freq: Once | INTRAVENOUS | Status: AC
  Administered 2017-09-03: 2 g via INTRAVENOUS

## 2017-09-03 ENCOUNTER — Inpatient Hospital Stay
Admission: RE | Admit: 2017-09-03 | Discharge: 2017-09-05 | DRG: 470 | Disposition: A | Discharge status: Home Health | Payer: Medicare Other | Source: Ambulatory Visit | Attending: Orthopedic Surgery | Admitting: Orthopedic Surgery

## 2017-09-03 ENCOUNTER — Encounter
Admission: RE | Disposition: A | Discharge status: Home Health | Payer: Self-pay | Source: Ambulatory Visit | Attending: Orthopedic Surgery

## 2017-09-03 ENCOUNTER — Inpatient Hospital Stay: Payer: Medicare Other | Admitting: Anesthesiology

## 2017-09-03 ENCOUNTER — Inpatient Hospital Stay: Payer: Medicare Other

## 2017-09-03 ENCOUNTER — Other Ambulatory Visit: Payer: Self-pay

## 2017-09-03 ENCOUNTER — Encounter: Payer: Self-pay | Admitting: *Deleted

## 2017-09-03 DIAGNOSIS — F329 Major depressive disorder, single episode, unspecified: Secondary | ICD-10-CM | POA: Diagnosis present

## 2017-09-03 DIAGNOSIS — R509 Fever, unspecified: Secondary | ICD-10-CM | POA: Diagnosis present

## 2017-09-03 DIAGNOSIS — Z87891 Personal history of nicotine dependence: Secondary | ICD-10-CM | POA: Diagnosis not present

## 2017-09-03 DIAGNOSIS — Z96642 Presence of left artificial hip joint: Secondary | ICD-10-CM | POA: Diagnosis present

## 2017-09-03 DIAGNOSIS — M1611 Unilateral primary osteoarthritis, right hip: Principal | ICD-10-CM | POA: Diagnosis present

## 2017-09-03 DIAGNOSIS — G8918 Other acute postprocedural pain: Secondary | ICD-10-CM

## 2017-09-03 DIAGNOSIS — I10 Essential (primary) hypertension: Secondary | ICD-10-CM | POA: Diagnosis present

## 2017-09-03 DIAGNOSIS — Z419 Encounter for procedure for purposes other than remedying health state, unspecified: Secondary | ICD-10-CM

## 2017-09-03 DIAGNOSIS — Z96641 Presence of right artificial hip joint: Secondary | ICD-10-CM

## 2017-09-03 HISTORY — PX: TOTAL HIP ARTHROPLASTY: SHX124

## 2017-09-03 LAB — CBC
HEMATOCRIT: 37.1 % (ref 35.0–47.0)
HEMOGLOBIN: 12.5 g/dL (ref 12.0–16.0)
MCH: 30.7 pg (ref 26.0–34.0)
MCHC: 33.6 g/dL (ref 32.0–36.0)
MCV: 91.2 fL (ref 80.0–100.0)
Platelets: 232 10*3/uL (ref 150–440)
RBC: 4.06 MIL/uL (ref 3.80–5.20)
RDW: 14.3 % (ref 11.5–14.5)
WBC: 16.5 10*3/uL — AB (ref 3.6–11.0)

## 2017-09-03 LAB — POCT I-STAT 4, (NA,K, GLUC, HGB,HCT)
Glucose, Bld: 108 mg/dL — ABNORMAL HIGH (ref 65–99)
HCT: 40 % (ref 36.0–46.0)
Hemoglobin: 13.6 g/dL (ref 12.0–15.0)
POTASSIUM: 3.9 mmol/L (ref 3.5–5.1)
Sodium: 141 mmol/L (ref 135–145)

## 2017-09-03 LAB — CREATININE, SERUM
Creatinine, Ser: 0.74 mg/dL (ref 0.44–1.00)
GFR calc Af Amer: >60 mL/min (ref >60)

## 2017-09-03 SURGERY — ARTHROPLASTY, HIP, TOTAL, ANTERIOR APPROACH
Anesthesia: General | Laterality: Right

## 2017-09-03 MED ORDER — ROCURONIUM BROMIDE 100 MG/10ML IV SOLN
INTRAVENOUS | Status: DC | PRN
  Administered 2017-09-03: 50 mg via INTRAVENOUS

## 2017-09-03 MED ORDER — SODIUM CHLORIDE 0.9 % IV SOLN
INTRAVENOUS | Status: DC
  Administered 2017-09-03: 12:00:00 via INTRAVENOUS
  Administered 2017-09-03: 75 mL/h via INTRAVENOUS

## 2017-09-03 MED ORDER — MAGNESIUM HYDROXIDE 400 MG/5ML PO SUSP: 30.0000 mL | Freq: Every day | ORAL | Status: DC | PRN

## 2017-09-03 MED ORDER — ESCITALOPRAM OXALATE 20 MG PO TABS
20.0000 mg | ORAL_TABLET | Freq: Every day | ORAL | Status: DC
  Administered 2017-09-04 – 2017-09-05 (×2): 20 mg via ORAL
  Filled 2017-09-03 (×2): qty 1

## 2017-09-03 MED ORDER — EPHEDRINE SULFATE 50 MG/ML IJ SOLN
INTRAMUSCULAR | Status: AC
  Filled 2017-09-03: qty 1

## 2017-09-03 MED ORDER — METHOCARBAMOL 500 MG PO TABS: 500.0000 mg | ORAL_TABLET | Freq: Four times a day (QID) | ORAL | Status: DC | PRN

## 2017-09-03 MED ORDER — ZOLPIDEM TARTRATE 5 MG PO TABS: 5.0000 mg | ORAL_TABLET | Freq: Every evening | ORAL | Status: DC | PRN

## 2017-09-03 MED ORDER — DIPHENHYDRAMINE HCL 12.5 MG/5ML PO ELIX: 12.5000 mg | ORAL_SOLUTION | ORAL | Status: DC | PRN

## 2017-09-03 MED ORDER — MAGNESIUM CITRATE PO SOLN
1.0000 | Freq: Once | ORAL | Status: DC | PRN
  Filled 2017-09-03: qty 296

## 2017-09-03 MED ORDER — FENTANYL CITRATE (PF) 100 MCG/2ML IJ SOLN
INTRAMUSCULAR | Status: DC | PRN
  Administered 2017-09-03 (×4): 50 ug via INTRAVENOUS
  Administered 2017-09-03: 25 ug via INTRAVENOUS
  Administered 2017-09-03: 50 ug via INTRAVENOUS
  Administered 2017-09-03: 25 ug via INTRAVENOUS

## 2017-09-03 MED ORDER — HYDROMORPHONE HCL 1 MG/ML IJ SOLN
0.5000 mg | INTRAMUSCULAR | Status: DC | PRN
  Administered 2017-09-03: 0.5 mg via INTRAVENOUS

## 2017-09-03 MED ORDER — CEFAZOLIN SODIUM-DEXTROSE 2-4 GM/100ML-% IV SOLN: 2.0000 g | Freq: Four times a day (QID) | INTRAVENOUS | Status: DC

## 2017-09-03 MED ORDER — BISACODYL 10 MG RE SUPP: 10.0000 mg | Freq: Every day | RECTAL | Status: DC | PRN

## 2017-09-03 MED ORDER — FAMOTIDINE 20 MG PO TABS
ORAL_TABLET | ORAL | Status: AC
  Filled 2017-09-03: qty 1

## 2017-09-03 MED ORDER — OXYCODONE HCL 5 MG PO TABS
10.0000 mg | ORAL_TABLET | ORAL | Status: DC | PRN
  Administered 2017-09-04: 10 mg via ORAL
  Administered 2017-09-05: 15 mg via ORAL
  Filled 2017-09-03: qty 3

## 2017-09-03 MED ORDER — SUGAMMADEX SODIUM 200 MG/2ML IV SOLN
INTRAVENOUS | Status: DC | PRN
  Administered 2017-09-03: 170 mg via INTRAVENOUS

## 2017-09-03 MED ORDER — FENTANYL CITRATE (PF) 100 MCG/2ML IJ SOLN
INTRAMUSCULAR | Status: AC
  Filled 2017-09-03: qty 2

## 2017-09-03 MED ORDER — ALUM & MAG HYDROXIDE-SIMETH 200-200-20 MG/5ML PO SUSP: 30.0000 mL | ORAL | Status: DC | PRN

## 2017-09-03 MED ORDER — SUCCINYLCHOLINE CHLORIDE 20 MG/ML IJ SOLN
INTRAMUSCULAR | Status: DC | PRN
  Administered 2017-09-03: 100 mg via INTRAVENOUS

## 2017-09-03 MED ORDER — HYDROMORPHONE HCL 1 MG/ML IJ SOLN
INTRAMUSCULAR | Status: AC
  Administered 2017-09-03: 0.5 mg via INTRAVENOUS
  Filled 2017-09-03: qty 1

## 2017-09-03 MED ORDER — ACETAMINOPHEN 325 MG PO TABS
325.0000 mg | ORAL_TABLET | Freq: Four times a day (QID) | ORAL | Status: DC | PRN
  Administered 2017-09-04: 650 mg via ORAL
  Filled 2017-09-03: qty 2

## 2017-09-03 MED ORDER — MORPHINE SULFATE ER 15 MG PO TBCR
15.0000 mg | EXTENDED_RELEASE_TABLET | Freq: Three times a day (TID) | ORAL | Status: DC
  Administered 2017-09-03 – 2017-09-05 (×7): 15 mg via ORAL
  Filled 2017-09-03 (×8): qty 1

## 2017-09-03 MED ORDER — FAMOTIDINE 20 MG PO TABS
20.0000 mg | ORAL_TABLET | Freq: Once | ORAL | Status: AC
  Administered 2017-09-03: 20 mg via ORAL

## 2017-09-03 MED ORDER — HYDROMORPHONE HCL 1 MG/ML IJ SOLN
0.5000 mg | INTRAMUSCULAR | Status: AC | PRN
  Administered 2017-09-03 (×4): 0.5 mg via INTRAVENOUS

## 2017-09-03 MED ORDER — SUGAMMADEX SODIUM 200 MG/2ML IV SOLN
INTRAVENOUS | Status: AC
  Filled 2017-09-03: qty 2

## 2017-09-03 MED ORDER — PROPOFOL 500 MG/50ML IV EMUL
INTRAVENOUS | Status: AC
  Filled 2017-09-03: qty 50

## 2017-09-03 MED ORDER — CEFAZOLIN SODIUM-DEXTROSE 2-4 GM/100ML-% IV SOLN
2.0000 g | Freq: Four times a day (QID) | INTRAVENOUS | Status: AC
  Administered 2017-09-03 (×2): 2 g via INTRAVENOUS
  Filled 2017-09-03 (×2): qty 100

## 2017-09-03 MED ORDER — DOCUSATE SODIUM 100 MG PO CAPS
100.0000 mg | ORAL_CAPSULE | Freq: Two times a day (BID) | ORAL | Status: DC
  Administered 2017-09-03 – 2017-09-05 (×5): 100 mg via ORAL
  Filled 2017-09-03 (×5): qty 1

## 2017-09-03 MED ORDER — METHOCARBAMOL 1000 MG/10ML IJ SOLN
500.0000 mg | Freq: Four times a day (QID) | INTRAVENOUS | Status: DC | PRN
  Filled 2017-09-03: qty 5

## 2017-09-03 MED ORDER — MENTHOL 3 MG MT LOZG
1.0000 | LOZENGE | OROMUCOSAL | Status: DC | PRN
  Filled 2017-09-03: qty 9

## 2017-09-03 MED ORDER — ENOXAPARIN SODIUM 40 MG/0.4ML ~~LOC~~ SOLN
40.0000 mg | SUBCUTANEOUS | Status: DC
  Administered 2017-09-04 – 2017-09-05 (×2): 40 mg via SUBCUTANEOUS
  Filled 2017-09-03: qty 0.4

## 2017-09-03 MED ORDER — MIDAZOLAM HCL 2 MG/2ML IJ SOLN
INTRAMUSCULAR | Status: DC | PRN
  Administered 2017-09-03: 2 mg via INTRAVENOUS

## 2017-09-03 MED ORDER — ONDANSETRON HCL 4 MG/2ML IJ SOLN: 4.0000 mg | Freq: Once | INTRAMUSCULAR | Status: DC | PRN

## 2017-09-03 MED ORDER — FENTANYL CITRATE (PF) 100 MCG/2ML IJ SOLN
25.0000 ug | INTRAMUSCULAR | Status: DC | PRN
  Administered 2017-09-03 (×4): 25 ug via INTRAVENOUS

## 2017-09-03 MED ORDER — ADULT MULTIVITAMIN W/MINERALS CH
1.0000 | ORAL_TABLET | Freq: Every day | ORAL | Status: DC
  Administered 2017-09-04 – 2017-09-05 (×2): 1 via ORAL
  Filled 2017-09-03 (×2): qty 1

## 2017-09-03 MED ORDER — METOCLOPRAMIDE HCL 5 MG/ML IJ SOLN: 5.0000 mg | Freq: Three times a day (TID) | INTRAMUSCULAR | Status: DC | PRN

## 2017-09-03 MED ORDER — DEXAMETHASONE SODIUM PHOSPHATE 10 MG/ML IJ SOLN
INTRAMUSCULAR | Status: DC | PRN
  Administered 2017-09-03: 5 mg via INTRAVENOUS

## 2017-09-03 MED ORDER — PHENYLEPHRINE HCL 10 MG/ML IJ SOLN
INTRAMUSCULAR | Status: DC | PRN
  Administered 2017-09-03: 100 ug via INTRAVENOUS

## 2017-09-03 MED ORDER — OXYCODONE HCL 5 MG PO TABS
5.0000 mg | ORAL_TABLET | ORAL | Status: DC | PRN
  Administered 2017-09-03 – 2017-09-05 (×5): 10 mg via ORAL
  Filled 2017-09-03 (×6): qty 2

## 2017-09-03 MED ORDER — PHENOL 1.4 % MT LIQD
1.0000 | OROMUCOSAL | Status: DC | PRN
  Filled 2017-09-03: qty 177

## 2017-09-03 MED ORDER — HYDROMORPHONE HCL 1 MG/ML IJ SOLN: 0.5000 mg | INTRAMUSCULAR | Status: DC | PRN

## 2017-09-03 MED ORDER — LACTATED RINGERS IV SOLN
INTRAVENOUS | Status: DC
  Administered 2017-09-03: 09:00:00 via INTRAVENOUS

## 2017-09-03 MED ORDER — GABAPENTIN 300 MG PO CAPS
300.0000 mg | ORAL_CAPSULE | Freq: Three times a day (TID) | ORAL | Status: DC
  Administered 2017-09-03 – 2017-09-04 (×4): 300 mg via ORAL
  Filled 2017-09-03 (×4): qty 1

## 2017-09-03 MED ORDER — ONDANSETRON HCL 4 MG/2ML IJ SOLN: 4.0000 mg | Freq: Four times a day (QID) | INTRAMUSCULAR | Status: DC | PRN

## 2017-09-03 MED ORDER — KETAMINE HCL 50 MG/ML IJ SOLN
INTRAMUSCULAR | Status: DC | PRN
  Administered 2017-09-03 (×2): 25 mg via INTRAMUSCULAR

## 2017-09-03 MED ORDER — ONDANSETRON HCL 4 MG/2ML IJ SOLN
INTRAMUSCULAR | Status: DC | PRN
  Administered 2017-09-03: 4 mg via INTRAVENOUS

## 2017-09-03 MED ORDER — ONDANSETRON HCL 4 MG/2ML IJ SOLN
INTRAMUSCULAR | Status: AC
  Filled 2017-09-03: qty 2

## 2017-09-03 MED ORDER — MIDAZOLAM HCL 2 MG/2ML IJ SOLN
INTRAMUSCULAR | Status: AC
  Filled 2017-09-03: qty 2

## 2017-09-03 MED ORDER — METOCLOPRAMIDE HCL 10 MG PO TABS: 5.0000 mg | ORAL_TABLET | Freq: Three times a day (TID) | ORAL | Status: DC | PRN

## 2017-09-03 MED ORDER — BUPIVACAINE-EPINEPHRINE 0.25% -1:200000 IJ SOLN
INTRAMUSCULAR | Status: DC | PRN
  Administered 2017-09-03: 30 mL

## 2017-09-03 MED ORDER — LIDOCAINE HCL (CARDIAC) 20 MG/ML IV SOLN
INTRAVENOUS | Status: DC | PRN
  Administered 2017-09-03: 60 mg via INTRAVENOUS

## 2017-09-03 MED ORDER — TRAMADOL HCL 50 MG PO TABS
50.0000 mg | ORAL_TABLET | Freq: Four times a day (QID) | ORAL | Status: DC
  Administered 2017-09-03 – 2017-09-05 (×6): 50 mg via ORAL
  Filled 2017-09-03 (×7): qty 1

## 2017-09-03 MED ORDER — KETAMINE HCL 50 MG/ML IJ SOLN
INTRAMUSCULAR | Status: AC
  Filled 2017-09-03: qty 20

## 2017-09-03 MED ORDER — FENTANYL CITRATE (PF) 100 MCG/2ML IJ SOLN
INTRAMUSCULAR | Status: AC
  Administered 2017-09-03: 25 ug via INTRAVENOUS
  Filled 2017-09-03: qty 2

## 2017-09-03 MED ORDER — LORATADINE 10 MG PO TABS: 10.0000 mg | ORAL_TABLET | Freq: Every day | ORAL | Status: DC | PRN

## 2017-09-03 MED ORDER — NEOMYCIN-POLYMYXIN B GU 40-200000 IR SOLN
Status: DC | PRN
  Administered 2017-09-03: 4 mL

## 2017-09-03 MED ORDER — CEFAZOLIN SODIUM-DEXTROSE 2-4 GM/100ML-% IV SOLN
INTRAVENOUS | Status: AC
  Filled 2017-09-03: qty 100

## 2017-09-03 MED ORDER — ONDANSETRON HCL 4 MG PO TABS: 4.0000 mg | ORAL_TABLET | Freq: Four times a day (QID) | ORAL | Status: DC | PRN

## 2017-09-03 MED ORDER — ALBUTEROL SULFATE HFA 108 (90 BASE) MCG/ACT IN AERS
INHALATION_SPRAY | RESPIRATORY_TRACT | Status: DC | PRN
  Administered 2017-09-03: 4 via RESPIRATORY_TRACT

## 2017-09-03 MED ORDER — PROPOFOL 10 MG/ML IV BOLUS
INTRAVENOUS | Status: DC | PRN
  Administered 2017-09-03: 120 mg via INTRAVENOUS

## 2017-09-03 SURGICAL SUPPLY — 56 items
BLADE SAW SAG 18.5X105 (BLADE) ×2 IMPLANT
BNDG COHESIVE 6X5 TAN STRL LF (GAUZE/BANDAGES/DRESSINGS) ×6 IMPLANT
CANISTER SUCT 1200ML W/VALVE (MISCELLANEOUS) ×2 IMPLANT
CHLORAPREP W/TINT 26ML (MISCELLANEOUS) ×2 IMPLANT
DRAPE C-ARM XRAY 36X54 (DRAPES) ×2 IMPLANT
DRAPE INCISE IOBAN 66X60 STRL (DRAPES) IMPLANT
DRAPE POUCH INSTRU U-SHP 10X18 (DRAPES) ×2 IMPLANT
DRAPE SHEET LG 3/4 BI-LAMINATE (DRAPES) ×6 IMPLANT
DRAPE TABLE BACK 80X90 (DRAPES) ×2 IMPLANT
DRESSING SURGICEL FIBRLLR 1X2 (HEMOSTASIS) ×2 IMPLANT
DRSG OPSITE POSTOP 4X8 (GAUZE/BANDAGES/DRESSINGS) ×2 IMPLANT
DRSG SURGICEL FIBRILLAR 1X2 (HEMOSTASIS) ×4
ELECT BLADE 6.5 EXT (BLADE) ×2 IMPLANT
ELECT REM PT RETURN 9FT ADLT (ELECTROSURGICAL) ×2
ELECTRODE REM PT RTRN 9FT ADLT (ELECTROSURGICAL) ×1 IMPLANT
EVACUATOR 1/8 PVC DRAIN (DRAIN) ×1 IMPLANT
GLOVE BIOGEL PI IND STRL 9 (GLOVE) ×1 IMPLANT
GLOVE BIOGEL PI INDICATOR 9 (GLOVE) ×4
GLOVE SURG SYN 9.0  PF PI (GLOVE) ×4
GLOVE SURG SYN 9.0 PF PI (GLOVE) ×2 IMPLANT
GOWN SRG 2XL LVL 4 RGLN SLV (GOWNS) ×1 IMPLANT
GOWN STRL NON-REIN 2XL LVL4 (GOWNS) ×2
GOWN STRL REUS W/ TWL LRG LVL3 (GOWN DISPOSABLE) ×1 IMPLANT
GOWN STRL REUS W/TWL LRG LVL3 (GOWN DISPOSABLE) ×2
HIP DBL LINER 54X28 (Liner) ×1 IMPLANT
HIP FEM HD S 28 (Head) ×1 IMPLANT
HOLDER FOLEY CATH W/STRAP (MISCELLANEOUS) ×1 IMPLANT
HOOD PEEL AWAY FLYTE STAYCOOL (MISCELLANEOUS) ×2 IMPLANT
KIT PREVENA INCISION MGT 13 (CANNISTER) ×2 IMPLANT
MAT BLUE FLOOR 46X72 FLO (MISCELLANEOUS) ×2 IMPLANT
NDL SAFETY ECLIPSE 18X1.5 (NEEDLE) ×1 IMPLANT
NDL SPNL 18GX3.5 QUINCKE PK (NEEDLE) ×1 IMPLANT
NEEDLE HYPO 18GX1.5 SHARP (NEEDLE) ×2
NEEDLE SPNL 18GX3.5 QUINCKE PK (NEEDLE) ×2 IMPLANT
NS IRRIG 1000ML POUR BTL (IV SOLUTION) ×2 IMPLANT
PACK HIP COMPR (MISCELLANEOUS) ×2 IMPLANT
SHELL ACETABULAR SZ 54 DM (Shell) ×1 IMPLANT
SOL PREP PVP 2OZ (MISCELLANEOUS) ×2
SOLUTION PREP PVP 2OZ (MISCELLANEOUS) ×1 IMPLANT
SPONGE DRAIN TRACH 4X4 STRL 2S (GAUZE/BANDAGES/DRESSINGS) ×1 IMPLANT
STAPLER SKIN PROX 35W (STAPLE) ×2 IMPLANT
STEM FEMORAL SZ3  STD COLLARED (Stem) ×1 IMPLANT
STRAP SAFETY 5IN WIDE (MISCELLANEOUS) ×2 IMPLANT
SUT DVC 2 QUILL PDO  T11 36X36 (SUTURE) ×1
SUT DVC 2 QUILL PDO T11 36X36 (SUTURE) ×1 IMPLANT
SUT SILK 0 (SUTURE) ×2
SUT SILK 0 30XBRD TIE 6 (SUTURE) ×1 IMPLANT
SUT V-LOC 90 ABS DVC 3-0 CL (SUTURE) ×2 IMPLANT
SUT VIC AB 1 CT1 36 (SUTURE) ×2 IMPLANT
SYR 20CC LL (SYRINGE) ×2 IMPLANT
SYR 30ML LL (SYRINGE) ×2 IMPLANT
SYR BULB IRRIG 60ML STRL (SYRINGE) ×2 IMPLANT
TAPE MICROFOAM 4IN (TAPE) ×2 IMPLANT
TOWEL OR 17X26 4PK STRL BLUE (TOWEL DISPOSABLE) ×2 IMPLANT
TRAY FOLEY W/METER SILVER 16FR (SET/KITS/TRAYS/PACK) ×1 IMPLANT
WND VAC CANISTER 500ML (MISCELLANEOUS) ×2 IMPLANT

## 2017-09-03 NOTE — Op Note (Signed)
09/03/2017  11:01 AM  PATIENT:  Emma Spears  62 y.o. female  PRE-OPERATIVE DIAGNOSIS:  PRIMARY LOCALIZED OSTEOARTHRITIS OF RIGHT HIP  POST-OPERATIVE DIAGNOSIS:  PRIMARY LOCALIZED OSTEOARTHRITIS OF RIGHT  PROCEDURE:  Procedure(s): TOTAL HIP ARTHROPLASTY ANTERIOR APPROACH (Right)  SURGEON: Leitha SchullerMichael J Luv Mish, MD  ASSISTANTS: none  ANESTHESIA:   general  EBL:  Total I/O In: 500 [I.V.:500] Out: 600 [Blood:600]  BLOOD ADMINISTERED:none  DRAINS: none   LOCAL MEDICATIONS USED:  MARCAINE     SPECIMEN:  Source of Specimen:  femoral head  DISPOSITION OF SPECIMEN:  PATHOLOGY  COUNTS:  YES  TOURNIQUET:  * No tourniquets in log *  IMPLANTS: Medacta AMIS 3 standard,stem, 54 mm DM Versafit DM cup, S 28 mm metal head and liner  DICTATION: .Dragon Dictation   The patient was brought to the operating room and after generall anesthesia was obtained patient was placed on the operative table with the ipsilateral foot into the Medacta attachment, contralateral leg on a well-padded table. C-arm was brought in and preop template x-ray taken. After prepping and draping in usual sterile fashion appropriate patient identification and timeout procedures were completed. Anterior approach to the hip was obtained and centered over the greater trochanter and TFL muscle. The subcutaneous tissue was incised hemostasis being achieved by electrocautery. TFL fascia was incised and the muscle retracted laterally deep retractor placed. The lateral femoral circumflex vessels were identified and ligated. The anterior capsule was exposed and a capsulotomy performed. The neck was identified and a femoral neck cut carried out with a saw. The head was removed without difficulty and showed sclerotic femoral head and acetabulum. Reaming was carried out to 52 mm and a 54 mm cup trial gave appropriate tightness to the acetabular component a 54 DM cup was impacted into position. The leg was then externally rotated and  ischiofemoral and pubofemoral releases carried out. The femur was sequentially broached to a size 3, size 3 standard trials were placed and the final components chosen. The 3standard stem was inserted along with a S 28 mm head and 54 mm liner. The hip was reduced and was stable the wound was thoroughly irrigated with fibrillar applied. The deep fascia closed using a heavy Quill after infiltration of 30 cc of quarter percent Sensorcaine with epinephrine. 3-0 v-loc subcuticular, staples and incisional wound vac  PLAN OF CARE: Admit to inpatient

## 2017-09-03 NOTE — Transfer of Care (Signed)
Immediate Anesthesia Transfer of Care Note  Patient: Emma Spears  Procedure(s) Performed: TOTAL HIP ARTHROPLASTY ANTERIOR APPROACH (Right )  Patient Location: PACU  Anesthesia Type:General  Level of Consciousness: sedated  Airway & Oxygen Therapy: Patient Spontanous Breathing and Patient connected to face mask oxygen  Post-op Assessment: Report given to RN and Post -op Vital signs reviewed and stable  Post vital signs: Reviewed and stable  Last Vitals:  Vitals Value Taken Time  BP 158/73 09/03/2017 10:57 AM  Temp 36.2 C 09/03/2017 10:57 AM  Pulse 70 09/03/2017 10:59 AM  Resp 23 09/03/2017 10:59 AM  SpO2 100 % 09/03/2017 10:59 AM  Vitals shown include unvalidated device data.  Last Pain:  Vitals:   09/03/17 0841  TempSrc: Tympanic  PainSc: 5          Complications: No apparent anesthesia complications

## 2017-09-03 NOTE — Anesthesia Postprocedure Evaluation (Signed)
Anesthesia Post Note  Patient: [name]  Procedure(s) Performed: TOTAL HIP ARTHROPLASTY ANTERIOR APPROACH (Right )  Patient location during evaluation: PACU Anesthesia Type: General Level of consciousness: awake and alert Pain management: pain level controlled Vital Signs Assessment: post-procedure vital signs reviewed and stable Respiratory status: spontaneous breathing, nonlabored ventilation, respiratory function stable and patient connected to nasal cannula oxygen Cardiovascular status: blood pressure returned to baseline and stable Postop Assessment: no apparent nausea or vomiting Anesthetic complications: no     Last Vitals:  Vitals:   09/03/17 0841 09/03/17 1057  BP: 138/72 (!) 158/73  Pulse: 79 71  Resp: 18 (!) 25  Temp: 36.5 C (!) 36.2 C  SpO2: 97% 100%    Last Pain:  Vitals:   09/03/17 1057  TempSrc:   PainSc: 10-Worst pain ever                 Yevette EdwardsJames G Adams

## 2017-09-03 NOTE — Progress Notes (Signed)
Pt admitted to room 133 from PACU. Pt is A&Ox4, and drowsy. Wound vac is intact to right hip. VSS. Pt oriented to room and pain medication regimen. Pt has voided x2 since admission.   Emma NeckHudson, Latricia HeftKorie G

## 2017-09-03 NOTE — Evaluation (Signed)
Physical Therapy Evaluation Patient Details Name: Emma BodeVictoria R Call MRN: [id number] DOB: 17-Jun-1955 Today's Date: 09/03/2017   History of Present Illness  62 y/o female s/p R total hip replacement (anterior approach), had L THR 12/18.  Clinical Impression  Pt did very well with PT exam on POD0.  She showed good mobility and safety with all tasks.  Pt was able to do ~15 minutes of AROM and resisted exercises apart from PT exam and was also able to tolerate ~10 minutes of gait training education and ultimately was able to manage an impressive POD0 bout of ambulation (~60 ft with minimal R side limp and walker reliance).  Pt on target to be able to go home with HHPT and did well.    Follow Up Recommendations Home health PT    Equipment Recommendations       Recommendations for Other Services       Precautions / Restrictions Precautions Precautions: Anterior Hip;Fall Restrictions Weight Bearing Restrictions: Yes RLE Weight Bearing: Weight bearing as tolerated      Mobility  Bed Mobility Overal bed mobility: Modified Independent             General bed mobility comments: Pt was able to get to sitting EOB w/o direct assist  Transfers Overall transfer level: Modified independent Equipment used: Rolling walker (2 wheeled)             General transfer comment: Pt was able to rise to standing w/o assist, good safety  Ambulation/Gait Ambulation/Gait assistance: Min guard Ambulation Distance (Feet): 60 Feet Assistive device: Rolling walker (2 wheeled)       General Gait Details: Pt did very well ambulating with walker in the room, she showed good confidence, tolerated WBing well and had only very minimal fatigue with the effort (sats stayed in the 90s on room air)  Stairs            Wheelchair Mobility    Modified Rankin (Stroke Patients Only)       Balance Overall balance assessment: Modified Independent                                           Pertinent Vitals/Pain Pain Assessment: 0-10 Pain Score: 4 (increased to 6/10 post session)    Home Living Family/patient expects to be discharged to:: Private residence Living Arrangements: Other relatives;Spouse/significant other Available Help at Discharge: Family(daughter will stay at her home for the first few weeks) Type of Home: House Home Access: Level entry     Home Layout: One level Home Equipment: Walker - 2 wheels;Walker - 4 wheels      Prior Function Level of Independence: Independent with assistive device(s)               Hand Dominance        Extremity/Trunk Assessment   Upper Extremity Assessment Upper Extremity Assessment: Overall WFL for tasks assessed    Lower Extremity Assessment Lower Extremity Assessment: (expected post-op weakness)       Communication   Communication: No difficulties  Cognition Arousal/Alertness: Awake/alert Behavior During Therapy: WFL for tasks assessed/performed Overall Cognitive Status: Within Functional Limits for tasks assessed                                        General Comments  Exercises Total Joint Exercises Ankle Circles/Pumps: Strengthening;10 reps Quad Sets: Strengthening;10 reps Gluteal Sets: Strengthening;10 reps Heel Slides: 10 reps;Strengthening Hip ABduction/ADduction: 10 reps;Strengthening Straight Leg Raises: AAROM;AROM;10 reps   Assessment/Plan    PT Assessment Patient needs continued PT services  PT Problem List Decreased strength;Decreased range of motion;Decreased activity tolerance;Decreased balance;Decreased mobility;Decreased coordination;Decreased knowledge of use of DME;Decreased safety awareness;Pain       PT Treatment Interventions DME instruction;Gait training;Stair training;Functional mobility training;Therapeutic activities;Therapeutic exercise;Balance training;Neuromuscular re-education;Patient/family education    PT Goals (Current goals can  be found in the Care Plan section)  Acute Rehab PT Goals Patient Stated Goal: go home PT Goal Formulation: With patient Time For Goal Achievement: 09/17/17 Potential to Achieve Goals: Good    Frequency BID   Barriers to discharge        Co-evaluation               AM-PAC PT "6 Clicks" Daily Activity  Outcome Measure Difficulty turning over in bed (including adjusting bedclothes, sheets and blankets)?: None Difficulty moving from lying on back to sitting on the side of the bed? : A Little Difficulty sitting down on and standing up from a chair with arms (e.g., wheelchair, bedside commode, etc,.)?: A Little Help needed moving to and from a bed to chair (including a wheelchair)?: None Help needed walking in hospital room?: A Little Help needed climbing 3-5 steps with a railing? : A Little 6 Click Score: 20    End of Session Equipment Utilized During Treatment: Gait belt Activity Tolerance: Patient tolerated treatment well Patient left: with call bell/phone within reach;with chair alarm set   PT Visit Diagnosis: Muscle weakness (generalized) (M62.81);Difficulty in walking, not elsewhere classified (R26.2)    Time: 4098-1191 PT Time Calculation (min) (ACUTE ONLY): 34 min   Charges:   PT Evaluation $PT Eval Low Complexity: 1 Low PT Treatments $Gait Training: 8-22 mins $Therapeutic Exercise: 8-22 mins   PT G Codes:        Malachi Pro, DPT 09/03/2017, 5:26 PM

## 2017-09-03 NOTE — NC FL2 (Signed)
  [place] MEDICAID FL2 LEVEL OF CARE SCREENING TOOL     IDENTIFICATION  Patient Name: Emma Spears Birthdate: [birth year] Sex: female Admission Date (Current Location): 09/03/2017  [place]   Facility and Address:  [facility], [address], [place], [place] [address]      Provider Number: [id number]  Attending Physician Name and Address:  Kennedy BuckerMenz, Michael, MD  Relative Name and Phone Number:       Current Level of Care: Hospital Recommended Level of Care: Skilled Nursing Facility Prior Approval Number:    Date Approved/Denied:   PASRR Number: ([id number] )  Discharge Plan: SNF    Current Diagnoses: Patient Active Problem List   Diagnosis Date Noted  . Status post total hip replacement, right 09/03/2017  . Primary localized osteoarthritis of left hip 05/28/2017    Orientation RESPIRATION BLADDER Height & Weight     Self, Time, Situation, Place  O2(2L Nasal Cannula) Continent Weight: 190 lb (86.2 kg) Height:  5\' 3"  (160 cm)  BEHAVIORAL SYMPTOMS/MOOD NEUROLOGICAL BOWEL NUTRITION STATUS      Continent Diet(Regular)  AMBULATORY STATUS COMMUNICATION OF NEEDS Skin   Extensive Assist Verbally Surgical wounds(Incision Right Hip)                       Personal Care Assistance Level of Assistance  Bathing, Feeding, Dressing Bathing Assistance: Limited assistance Feeding assistance: Independent Dressing Assistance: Limited assistance     Functional Limitations Info  Sight, Hearing, Speech Sight Info: Adequate Hearing Info: Adequate Speech Info: Adequate    SPECIAL CARE FACTORS FREQUENCY  PT (By licensed PT), OT (By licensed OT)     PT Frequency: (5) OT Frequency: (5)            Contractures      Additional Factors Info  Code Status, Allergies Code Status Info: (Full Code) Allergies Info: (VICODIN HYDROCODONE-ACETAMINOPHEN )           Current Medications (09/03/2017):  This is the  current hospital active medication list Current Facility-Administered Medications  Medication Dose Route Frequency Provider Last Rate Last Dose  . 0.9 %  sodium chloride infusion   Intravenous Continuous Kennedy BuckerMenz, Michael, MD 75 mL/hr at 09/03/17 1149    . famotidine (PEPCID) 20 MG tablet           . fentaNYL (SUBLIMAZE) injection 25 mcg  25 mcg Intravenous Q5 min PRN Yevette EdwardsAdams, James G, MD   25 mcg at 09/03/17 1123  . HYDROmorphone (DILAUDID) 1 MG/ML injection           . HYDROmorphone (DILAUDID) injection 0.5 mg  0.5 mg Intravenous Q10 min PRN Yevette EdwardsAdams, James G, MD   0.5 mg at 09/03/17 1148  . lactated ringers infusion   Intravenous Continuous Penwarden, Amy, MD 100 mL/hr at 09/03/17 0849    . ondansetron (ZOFRAN) injection 4 mg  4 mg Intravenous Once PRN Yevette EdwardsAdams, James G, MD         Discharge Medications: Please see discharge summary for a list of discharge medications.  Relevant Imaging Results:  Relevant Lab Results:   Additional Information (SSN: [id number])  Payton SparkAnanda A Jayliani Wanner, Student-Social Work

## 2017-09-03 NOTE — Anesthesia Procedure Notes (Signed)
Procedure Name: Intubation Date/Time: 09/03/2017 9:30 AM Performed by: Yevette EdwardsAdams, James G, MD Pre-anesthesia Checklist: Patient identified, Emergency Drugs available, Suction available, Patient being monitored and Timeout performed Patient Re-evaluated:Patient Re-evaluated prior to induction Oxygen Delivery Method: Circle system utilized Preoxygenation: Pre-oxygenation with 100% oxygen Induction Type: IV induction Ventilation: Mask ventilation without difficulty Laryngoscope Size: McGraph and 4 Grade View: Grade I Tube type: Oral Tube size: 7.0 mm Number of attempts: 1 Airway Equipment and Method: Stylet Placement Confirmation: ETT inserted through vocal cords under direct vision,  positive ETCO2 and breath sounds checked- equal and bilateral Secured at: 20 cm Tube secured with: Tape Dental Injury: Teeth and Oropharynx as per pre-operative assessment

## 2017-09-03 NOTE — H&P (Signed)
Reviewed paper H+P, will be scanned into chart. No changes noted.  

## 2017-09-03 NOTE — Anesthesia Preprocedure Evaluation (Signed)
Anesthesia Evaluation  Patient identified by MRN, date of birth, ID band Patient awake    Reviewed: Allergy & Precautions, H&P , NPO status , Patient's Chart, lab work & pertinent test results, reviewed documented beta blocker date and time   Airway Mallampati: II  TM Distance: >3 FB Neck ROM: full    Dental  (+) Teeth Intact, Upper Dentures   Pulmonary neg pulmonary ROS, former smoker,    Pulmonary exam normal        Cardiovascular Exercise Tolerance: Good negative cardio ROS Normal cardiovascular exam Rhythm:regular Rate:Normal     Neuro/Psych Multiple back surgeries with a spinal cord stimulator.   negative neurological ROS  negative psych ROS   GI/Hepatic negative GI ROS, Neg liver ROS,   Endo/Other  negative endocrine ROS  Renal/GU negative Renal ROS  negative genitourinary   Musculoskeletal   Abdominal   Peds  Hematology negative hematology ROS (+)   Anesthesia Other Findings Past Medical History: No date: Anxiety No date: Arthritis No date: Depression Past Surgical History: 1981: ABDOMINAL HYSTERECTOMY; N/A  2 in 2001, 2003,  2 in 2005: BACK SURGERY; N/A     Comment:  herniated discs, implant repair work No date: CHOLECYSTECTOMY 09/1975: GALLBLADDER SURGERY; N/A 2018: JOINT REPLACEMENT; Left     Comment:  total hip 2003: SPINAL CORD STIMULATOR IMPLANT; Right 05/28/2017: TOTAL HIP ARTHROPLASTY; Left     Comment:  Procedure: TOTAL HIP ARTHROPLASTY ANTERIOR APPROACH;                Surgeon: Kennedy BuckerMenz, Michael, MD;  Location: [facility] ORS;                Service: Orthopedics;  Laterality: Left; BMI    Body Mass Index:  33.66 kg/m     Reproductive/Obstetrics negative OB ROS                             Anesthesia Physical Anesthesia Plan  ASA: III  Anesthesia Plan: General ETT   Post-op Pain Management:    Induction:   PONV Risk Score and Plan: 4 or greater  Airway  Management Planned:   Additional Equipment:   Intra-op Plan:   Post-operative Plan:   Informed Consent: I have reviewed the patients History and Physical, chart, labs and discussed the procedure including the risks, benefits and alternatives for the proposed anesthesia with the patient or authorized representative who has indicated his/her understanding and acceptance.   Dental Advisory Given  Plan Discussed with: CRNA  Anesthesia Plan Comments: (Prefers GOT. JA)        Anesthesia Quick Evaluation

## 2017-09-03 NOTE — Anesthesia Post-op Follow-up Note (Signed)
Anesthesia QCDR form completed.        

## 2017-09-04 LAB — CBC
HCT: 35.1 % (ref 35.0–47.0)
HEMOGLOBIN: 11.6 g/dL — AB (ref 12.0–16.0)
MCH: 29.9 pg (ref 26.0–34.0)
MCHC: 33 g/dL (ref 32.0–36.0)
MCV: 90.4 fL (ref 80.0–100.0)
Platelets: 206 10*3/uL (ref 150–440)
RBC: 3.89 MIL/uL (ref 3.80–5.20)
RDW: 14 % (ref 11.5–14.5)
WBC: 10.3 10*3/uL (ref 3.6–11.0)

## 2017-09-04 LAB — BASIC METABOLIC PANEL
ANION GAP: 7 (ref 5–15)
BUN: 16 mg/dL (ref 6–20)
CHLORIDE: 103 mmol/L (ref 101–111)
CO2: 27 mmol/L (ref 22–32)
CREATININE: 0.74 mg/dL (ref 0.44–1.00)
Calcium: 8.5 mg/dL — ABNORMAL LOW (ref 8.9–10.3)
GFR calc non Af Amer: >60 mL/min (ref >60)
Glucose, Bld: 122 mg/dL — ABNORMAL HIGH (ref 65–99)
POTASSIUM: 3.8 mmol/L (ref 3.5–5.1)
Sodium: 137 mmol/L (ref 135–145)

## 2017-09-04 MED ORDER — DOCUSATE SODIUM 100 MG PO CAPS: 100.0000 mg | ORAL_CAPSULE | Freq: Two times a day (BID) | ORAL | 0 refills | Status: DC

## 2017-09-04 MED ORDER — OXYCODONE HCL 10 MG PO TABS: 10.0000 mg | ORAL_TABLET | ORAL | 0 refills | Status: DC | PRN

## 2017-09-04 MED ORDER — METHOCARBAMOL 500 MG PO TABS: 500.0000 mg | ORAL_TABLET | Freq: Four times a day (QID) | ORAL | 0 refills | Status: DC | PRN

## 2017-09-04 MED ORDER — ACETAMINOPHEN 325 MG PO TABS: 325.0000 mg | ORAL_TABLET | Freq: Four times a day (QID) | ORAL | Status: AC | PRN

## 2017-09-04 MED ORDER — ENOXAPARIN SODIUM 40 MG/0.4ML ~~LOC~~ SOLN: 40.0000 mg | SUBCUTANEOUS | 0 refills | Status: DC

## 2017-09-04 NOTE — Progress Notes (Signed)
Clinical Social Worker (CSW) received SNF consult. PT is recommending home health. RN case manager aware of above. Please reconsult if future social work needs arise. CSW signing off.   Naleah Kofoed, LCSW [phone] 

## 2017-09-04 NOTE — Evaluation (Signed)
Occupational Therapy Evaluation Patient Details Name: Emma Spears MRN: [id number] DOB: 09-13-1955 Today's Date: 09/04/2017    History of Present Illness Pt. is a 61 y.o. female who was admitted to [facility] for an anterior approach Right THR. Pt. had a recent Left THR on 12/18.   Clinical Impression   Pt. Presents with weakness, limited activity tolerance, and limited functional mobility which limits the ability to complete basic ADL and IADL functioning. Pt. Resides at home with her grandson, and her husband who is care dependent, and w/c bound. 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 Pt. Was independent with ADLs (however required minA to hike pants secondary to shoulder limitations. Pt. Was independent with IADL functioning: including meal preparation, and medication management. Pt. Was able to drive. Pt. Has very support family members. Pt. education was provided about A/E use for LE ADLs.Pt. Has had recent left hip surgery. Pt. Could benefit from OT services for ADL training, A/E training, and pt. Education about home modification, and DME. Pt. palns to return home upon discharge with her husband. Pt.'s daughter plans to stay with pt. Pt.'s grandson, and daughters plan to assist as needed.     Follow Up Recommendations  No OT follow up    Equipment Recommendations       Recommendations for Other Services       Precautions / Restrictions Precautions Precautions: Anterior Hip;Fall Restrictions Weight Bearing Restrictions: Yes RLE Weight Bearing: Weight bearing as tolerated      Mobility Bed Mobility Overal bed mobility: Modified Independent                Transfers Overall transfer level: Modified independent Equipment used: Rolling walker (2 wheeled)             General transfer comment: Mobility per PT    Balance                                           ADL either performed or assessed with clinical judgement   ADL Overall ADL's :  Needs assistance/impaired Eating/Feeding: Independent   Grooming: Independent;Set up   Upper Body Bathing: Independent;Set up   Lower Body Bathing: Set up;Minimal assistance   Upper Body Dressing : Set up;Minimal assistance   Lower Body Dressing: Set up;Minimal assistance               Functional mobility during ADLs: Min guard General ADL Comments: Pt. education was provided about A/E use for LE ADLs.     Vision         Perception     Praxis      Pertinent Vitals/Pain Pain Assessment: No/denies pain Pain Score: 0-No pain     Hand Dominance     Extremity/Trunk Assessment Upper Extremity Assessment Upper Extremity Assessment: Generalized weakness(Bilateral shoulder limitations from previous old injuries.)           Communication Communication Communication: No difficulties   Cognition Arousal/Alertness: Awake/alert Behavior During Therapy: WFL for tasks assessed/performed Overall Cognitive Status: Within Functional Limits for tasks assessed                                     General Comments       Exercises     Shoulder Instructions      Home Living Family/patient  expects to be discharged to:: Private residence Living Arrangements: Other relatives;Spouse/significant other Available Help at Discharge: Family Type of Home: House Home Access: Level entry     Home Layout: One level               Home Equipment: Environmental consultantWalker - 2 wheels;Walker - 4 wheels          Prior Functioning/Environment Level of Independence: Independent with assistive device(s)        Comments: has rollater, used 24/7 for last few weeks due to pain        OT Problem List: Decreased strength;Decreased knowledge of use of DME or AE;Pain;Decreased range of motion;Impaired UE functional use      OT Treatment/Interventions: Self-care/ADL training;Therapeutic exercise;DME and/or AE instruction;Therapeutic activities    OT Goals(Current goals can be  found in the care plan section) Acute Rehab OT Goals Patient Stated Goal: To return home OT Goal Formulation: With patient Potential to Achieve Goals: Good  OT Frequency: Min 1X/week   Barriers to D/C:            Co-evaluation              AM-PAC PT "6 Clicks" Daily Activity     Outcome Measure Help from another person eating meals?: None Help from another person taking care of personal grooming?: A Little Help from another person toileting, which includes using toliet, bedpan, or urinal?: A Little Help from another person bathing (including washing, rinsing, drying)?: A Little Help from another person to put on and taking off regular upper body clothing?: A Little Help from another person to put on and taking off regular lower body clothing?: A Little 6 Click Score: 19   End of Session Equipment Utilized During Treatment: Gait belt  Activity Tolerance: Patient tolerated treatment well Patient left: in bed                   Time: [id number] OT Time Calculation (min): 18 min Charges:  OT General Charges $OT Visit: 1 Visit OT Evaluation $OT Eval Moderate Complexity: 1 Mod G-Codes:     Olegario MessierElaine Darrin Koman, MS, OTR/L   Olegario MessierElaine Maebelle Sulton, MS, OTR/L 09/04/2017, 10:09 AM

## 2017-09-04 NOTE — Progress Notes (Signed)
   Subjective: 1 Day Post-Op Procedure(s) (LRB): TOTAL HIP ARTHROPLASTY ANTERIOR APPROACH (Right) Patient reports pain as mild.   Patient is well, and has had no acute complaints or problems.,  She denies any urinary symptoms, cough, congestion. Denies any CP, SOB, ABD pain. We will continue therapy today.  Plan is to go Home after hospital stay.  Objective: Vital signs in last 24 hours: Temp:  [97.2 F (36.2 C)-101.1 F (38.4 C)] 101.1 F (38.4 C) (03/29 0749) Pulse Rate:  [71-93] 89 (03/29 0749) Resp:  [13-25] 18 (03/29 0442) BP: (124-179)/(66-99) 140/73 (03/29 0749) SpO2:  [90 %-100 %] 92 % (03/29 0749) FiO2 (%):  [21 %] 21 % (03/28 1335) Weight:  [86.2 kg (190 lb)] 86.2 kg (190 lb) (03/28 0841)  Intake/Output from previous day: 03/28 0701 - 03/29 0700 In: 3435 [P.O.:960; I.V.:2375; IV Piggyback:100] Out: 600 [Blood:600] Intake/Output this shift: No intake/output data recorded.  Recent Labs    09/03/17 0857 09/03/17 1401 09/04/17 0427  HGB 13.6 12.5 11.6*   Recent Labs    09/03/17 1401 09/04/17 0427  WBC 16.5* 10.3  RBC 4.06 3.89  HCT 37.1 35.1  PLT 232 206   Recent Labs    09/03/17 0857 09/03/17 1401 09/04/17 0427  NA 141  --  137  K 3.9  --  3.8  CL  --   --  103  CO2  --   --  27  BUN  --   --  16  CREATININE  --  0.74 0.74  GLUCOSE 108*  --  122*  CALCIUM  --   --  8.5*   No results for input(s): LABPT, INR in the last 72 hours.  EXAM General - Patient is Alert, Appropriate and Oriented Extremity - Sensation intact distally Intact pulses distally Dorsiflexion/Plantar flexion intact Incision: dressing C/D/I and no drainage No cellulitis present Compartment soft Dressing - dressing C/D/I and no drainage.  Wound VAC intact with no drainage.  Mild ecchymosis noted along the distal incision site.  Thigh is soft. Motor Function - intact, moving foot and toes well on exam.   Past Medical History:  Diagnosis Date  . Anxiety   . Arthritis   .  Depression     Assessment/Plan:   1 Day Post-Op Procedure(s) (LRB): TOTAL HIP ARTHROPLASTY ANTERIOR APPROACH (Right) Active Problems:   Status post total hip replacement, right  Estimated body mass index is 33.66 kg/m as calculated from the following:   Height as of this encounter: 5\' 3"  (1.6 m).   Weight as of this encounter: 86.2 kg (190 lb). Advance diet Up with therapy  Needs bowel movement Recheck labs in the morning, labs are stable Patient febrile, 101.1, encourage incentive spirometer will continue to monitor.  If continued fevers will order chest x-ray urinalysis, blood cultures.  Incision appears well with no signs of infection. Care management to assist with discharge   DVT Prophylaxis - Lovenox, Foot Pumps and TED hose Weight-Bearing as tolerated to right leg   T. Cranston Neighborhris Alexes Menchaca, PA-C [facility] 09/04/2017, 8:26 AM

## 2017-09-04 NOTE — Discharge Instructions (Signed)

## 2017-09-04 NOTE — Progress Notes (Signed)
Physical Therapy Treatment Patient Details Name: Emma BodeVictoria R Berendt MRN: [id number] DOB: 1956-04-20 Today's Date: 09/04/2017    History of Present Illness Pt. is a 62 y.o. female who [facility] for an anterior approach Right THR. Pt. had a recent Left THR on 12/18.    PT Comments    Pt with considerably more pain this afternoon than either previous PT session but she was still motivated to work hard with PT and do what she could.  She was again able to walk ~125 ft with walker, she was safe and confident with this but did have decreased speed and quality of cadence this afternoon.  Pt continues to be able to do all mobility tasks w/o assist, and has good quad control and did well with exercises.  Follow Up Recommendations  Home health PT     Equipment Recommendations  None recommended by PT    Recommendations for Other Services       Precautions / Restrictions Precautions Precautions: Anterior Hip;Fall Restrictions RLE Weight Bearing: Weight bearing as tolerated    Mobility  Bed Mobility Overal bed mobility: Modified Independent             General bed mobility comments: Pt slow to get R LE up to EOB, but independent with the transition  Transfers Overall transfer level: Independent Equipment used: Rolling walker (2 wheeled)             General transfer comment: Pt easily rises to standing with good foreknowledge of hand use and positioning  Ambulation/Gait Ambulation/Gait assistance: Supervision Ambulation Distance (Feet): 125 Feet Assistive device: Rolling walker (2 wheeled)       General Gait Details: Pt initially with some limp, hesitation that greatly improves with inceased distance.  Overall safe and confident, though pain this afternoon did limit her somewhat.   Stairs            Wheelchair Mobility    Modified Rankin (Stroke Patients Only)       Balance Overall balance assessment: Modified Independent                                          Cognition Arousal/Alertness: Awake/alert Behavior During Therapy: WFL for tasks assessed/performed Overall Cognitive Status: Within Functional Limits for tasks assessed                                        Exercises Total Joint Exercises Ankle Circles/Pumps: Strengthening;10 reps Quad Sets: Strengthening;10 reps Heel Slides: 10 reps;Strengthening Hip ABduction/ADduction: 10 reps;Strengthening Long Arc Quad: Strengthening;10 reps    General Comments        Pertinent Vitals/Pain Pain Score: 9     Home Living                      Prior Function            PT Goals (current goals can now be found in the care plan section) Progress towards PT goals: Progressing toward goals    Frequency    BID      PT Plan Current plan remains appropriate    Co-evaluation              AM-PAC PT "6 Clicks" Daily Activity  Outcome Measure  Difficulty turning over  in bed (including adjusting bedclothes, sheets and blankets)?: None Difficulty moving from lying on back to sitting on the side of the bed? : A Little Difficulty sitting down on and standing up from a chair with arms (e.g., wheelchair, bedside commode, etc,.)?: None Help needed moving to and from a bed to chair (including a wheelchair)?: None Help needed walking in hospital room?: None Help needed climbing 3-5 steps with a railing? : A Little 6 Click Score: 22    End of Session Equipment Utilized During Treatment: Gait belt Activity Tolerance: Patient tolerated treatment well Patient left: with call bell/phone within reach;with chair alarm set   PT Visit Diagnosis: Muscle weakness (generalized) (M62.81);Difficulty in walking, not elsewhere classified (R26.2)     Time: 7846-9629 PT Time Calculation (min) (ACUTE ONLY): 25 min  Charges:  $Gait Training: 8-22 mins $Therapeutic Exercise: 8-22 mins                    G Codes:       Malachi Pro,  DPT 09/04/2017, 4:59 PM

## 2017-09-04 NOTE — Discharge Summary (Addendum)
Physician Discharge Summary  Patient ID: KYNSLEY WHITEHOUSE MRN: [id number] DOB/AGE: [birth year] 62 y.o.  Admit date: 09/03/2017 Discharge date: 09/05/2017 Admission Diagnoses:  PRIMARY LOCALIZED OSTEOARTHRITIS OF RIGHT HIP   Discharge Diagnoses: Patient Active Problem List   Diagnosis Date Noted  . Status post total hip replacement, right 09/03/2017  . Primary localized osteoarthritis of left hip 05/28/2017    Past Medical History:  Diagnosis Date  . Anxiety   . Arthritis   . Depression      Transfusion: none   Consultants (if any):   Discharged Condition: Improved  Hospital Course: DELANEE XIN is an 62 y.o. female who was admitted 09/03/2017 with a diagnosis of right hip osteoarthritis and went to the operating room on 09/03/2017 and underwent the above named procedures.    Surgeries: Procedure(s): TOTAL HIP ARTHROPLASTY ANTERIOR APPROACH on 09/03/2017 Patient tolerated the surgery well. Taken to PACU where she was stabilized and then transferred to the orthopedic floor.  Started on Lovenox 40 mg q  24 hrs. Foot pumps applied bilaterally at 80 mm. Heels elevated on bed with rolled towels. No evidence of DVT. Negative Homan. Physical therapy started on day #1 for gait training and transfer.  The patient ambulated 125 feet with physical therapy.  OT started day #1 for ADL and assisted devices.  Patient's foley was d/c on day #1. Patient's IV  was d/c on day #2.  On post op day #2 patient was stable and ready for discharge to home with HHPT.  Implants: Medacta AMIS 3 standard,stem, 54 mm DM Versafit DM cup, S 28 mm metal head and liner    She was given perioperative antibiotics:  Anti-infectives (From admission, onward)   Start     Dose/Rate Route Frequency Ordered Stop   09/03/17 1600  ceFAZolin (ANCEF) IVPB 2g/100 mL premix     2 g 200 mL/hr over 30 Minutes Intravenous Every 6 hours 09/03/17 1400 09/03/17 2221   09/03/17 1345  ceFAZolin (ANCEF) IVPB 2g/100 mL premix   Status:  Discontinued     2 g 200 mL/hr over 30 Minutes Intravenous Every 6 hours 09/03/17 1334 09/03/17 1400   09/03/17 0846  ceFAZolin (ANCEF) 2-4 GM/100ML-% IVPB    Note to Pharmacy:  Ronnell Freshwater   : cabinet override      09/03/17 0846 09/03/17 0938   09/02/17 2345  ceFAZolin (ANCEF) IVPB 2g/100 mL premix     2 g 200 mL/hr over 30 Minutes Intravenous  Once 09/02/17 2342 09/03/17 0948    .  She was given sequential compression devices, early ambulation, and Lovenox for DVT prophylaxis.  She benefited maximally from the hospital stay and there were no complications.    Recent vital signs:  Vitals:   09/04/17 1610 09/04/17 2332  BP: 121/77 (!) 105/57  Pulse: 79 87  Resp: 18 16  Temp: 99 F (37.2 C) 98.2 F (36.8 C)  SpO2: 92% 95%    Recent laboratory studies:  Lab Results  Component Value Date   HGB 11.0 (L) 09/05/2017   HGB 11.6 (L) 09/04/2017   HGB 12.5 09/03/2017   Lab Results  Component Value Date   WBC 8.7 09/05/2017   PLT 172 09/05/2017   Lab Results  Component Value Date   INR 1.02 08/25/2017   Lab Results  Component Value Date   NA 136 09/05/2017   K 3.5 09/05/2017   CL 98 (L) 09/05/2017   CO2 29 09/05/2017   BUN 10 09/05/2017   CREATININE  0.69 09/05/2017   GLUCOSE 117 (H) 09/05/2017    Discharge Medications:   Allergies as of 09/05/2017      Reactions   Vicodin [hydrocodone-acetaminophen] Nausea And Vomiting      Medication List    TAKE these medications   acetaminophen 325 MG tablet Commonly known as:  TYLENOL Take 1-2 tablets (325-650 mg total) by mouth every 6 (six) hours as needed for mild pain (pain score 1-3 or temp > 100.5).   docusate sodium 100 MG capsule Commonly known as:  COLACE Take 1 capsule (100 mg total) by mouth 2 (two) times daily.   enoxaparin 40 MG/0.4ML injection Commonly known as:  LOVENOX Inject 0.4 mLs (40 mg total) into the skin daily for 14 days.   escitalopram 20 MG tablet Commonly known as:   LEXAPRO Take 20 mg by mouth daily.   gabapentin 300 MG capsule Commonly known as:  NEURONTIN Take 300 mg by mouth 3 (three) times daily. 387m breakfast, 3075mlunch, 60068medtime   indomethacin 25 MG capsule Commonly known as:  INDOCIN Take 25 mg by mouth 2 (two) times daily with a meal.   loratadine 10 MG tablet Commonly known as:  CLARITIN Take 10 mg by mouth daily as needed for allergies.   methocarbamol 500 MG tablet Commonly known as:  ROBAXIN Take 1 tablet (500 mg total) by mouth every 6 (six) hours as needed for muscle spasms.   morphine 15 MG 12 hr tablet Commonly known as:  MS CONTIN Take 1 tablet by mouth 3 (three) times daily.   multivitamin with minerals Tabs tablet Take 1 tablet by mouth daily.   NARCAN 4 MG/0.1ML Liqd nasal spray kit Generic drug:  naloxone 1 SPRAY RIGHT NOSTRIL ONCE IF NEED. IF FOUND UNRESPONSIVE, CALL 911 CAN REPEAT IN 5 MIN UP TO 2 DOSE   Oxycodone HCl 10 MG Tabs Take 1-1.5 tablets (10-15 mg total) by mouth every 4 (four) hours as needed for severe pain (pain score 7-10). What changed:    medication strength  how much to take  reasons to take this       Diagnostic Studies: Dg Hip Operative Unilat W Or W/o Pelvis Right  Result Date: 09/03/2017 CLINICAL DATA:  Right total hip replacement. EXAM: OPERATIVE right HIP (WITH PELVIS IF PERFORMED) 3 VIEWS TECHNIQUE: Fluoroscopic spot image(s) were submitted for interpretation post-operatively. Fluoro time reported is 12 seconds. COMPARISON:  None in PACs FINDINGS: The patient has undergone right total hip joint prosthesis placement. Radiographic positioning of the prosthetic components is good. The distal tip of the femoral component of the prosthesis is not included in the field of view. IMPRESSION: No immediate postprocedure complication following right hip joint prosthesis placement. The tip of the the femoral component of the prosthesis is not included in the field of view. Electronically  Signed   By: David  JorMartiniqueD.   On: 09/03/2017 11:06   Dg Hip Unilat W Or W/o Pelvis 2-3 Views Right  Result Date: 09/03/2017 CLINICAL DATA:  Status post right total hip joint prosthesis placement. EXAM: DG HIP (WITH OR WITHOUT PELVIS) 2-3V RIGHT COMPARISON:  Fluoro spot images of today's date FINDINGS: The patient has undergone right hip joint prosthesis placement. Radiographic positioning of the prosthetic components is good. The interface with the native bone appears normal. There is no acute native bone fracture. A wound VAC is present. There is skin staples present. IMPRESSION: No immediate postprocedure complication following right total hip joint prosthesis placement. Electronically Signed  By: David  Martinique M.D.   On: 09/03/2017 12:09    Disposition: Discharge disposition: 01-Home or [place]    Hessie Knows, MD Follow up in 2 week(s).   Specialty:  Orthopedic Surgery Contact information: [place] [address] [phone]            SignedPrescott Parma, Junette Bernat 09/05/2017, 6:04 AM

## 2017-09-04 NOTE — Care Management Note (Signed)
Case Management Note  Patient Details  Name: Emma Spears MRN: [id number] Date of Birth: Mar 20, 1956  Subjective/Objective:  POD # 1 right hip arthroplasty. Met with patient at bedside to discuss discharge planning. Patient lives at home with her spouse and grandson. She has a walker and bsc. Offered chose of home health agencies. She used [facility] in the past and prefers them. Referral to [facility] with [facility] for HHPT.  Pharmacy: [facility]- [phone]. Called Lovenox 40 mg # 14 no refills per Dr. Rudene Christians. Cost of Lovenox is $ 103.82. Patient updated and denies issues paying for medication.                 Action/Plan: [facility] for HHPT, No DME, Lovenox called in.   Expected Discharge Date:                  Expected Discharge Plan:  [place]  In-House Referral:     Discharge planning Services  CM Consult  Post Acute Care Choice:  Home Health Choice offered to:  Patient  DME Arranged:    DME Agency:     HH Arranged:  PT [place]:  [place]  Status of Service:  In process, will continue to follow  If discussed at Long Length of Stay Meetings, dates discussed:    Additional Comments:  Jolly Mango, RN 09/04/2017, 9:30 AM

## 2017-09-04 NOTE — Progress Notes (Signed)
  Patient does not wish to take Gabapentin during the day since it makes her very sleepy. She will take at night.

## 2017-09-04 NOTE — Progress Notes (Signed)
Physical Therapy Treatment Patient Details Name: Emma Spears MRN: [id number] DOB: Dec 14, 1955 Today's Date: 09/04/2017    History of Present Illness Pt. is a 62 y.o. female who was [facility] for an anterior approach Right THR. Pt. had a recent Left THR on 12/18.    PT Comments    Pt with minimal pain, good confidence and excellent execution with transfers/ambulation.  She easily walked ~125 ft with consistent and confident cadence and minimal reliance on the walker.  She did not have increased pain with the effort and though she had some fatigue she ultimately did very well. Pt with good LE strength during exercises and generally is at or beyond expected PT goals at this point.     Follow Up Recommendations  Home health PT     Equipment Recommendations  None recommended by PT    Recommendations for Other Services       Precautions / Restrictions Precautions Precautions: Anterior Hip;Fall Precaution Booklet Issued: (pt still has from previous sx) Restrictions Weight Bearing Restrictions: Yes RLE Weight Bearing: Weight bearing as tolerated    Mobility  Bed Mobility Overal bed mobility: Modified Independent             General bed mobility comments: Pt up in recliner on arrival  Transfers Overall transfer level: Independent Equipment used: Rolling walker (2 wheeled)             General transfer comment: Pt easily rises to standing with good foreknowledge of hand use and positioning  Ambulation/Gait Ambulation/Gait assistance: Supervision Ambulation Distance (Feet): 125 Feet Assistive device: Rolling walker (2 wheeled)       General Gait Details: Pt was able to maintain smooth, consistent cadence the entire time with no hesitation and consistent walker motion.  She had essentially no limp or hesitation and only minimal fatigue with the effort.    Stairs            Wheelchair Mobility    Modified Rankin (Stroke Patients Only)        Balance Overall balance assessment: Modified Independent                                          Cognition Arousal/Alertness: Awake/alert Behavior During Therapy: WFL for tasks assessed/performed Overall Cognitive Status: Within Functional Limits for tasks assessed                                        Exercises Total Joint Exercises Ankle Circles/Pumps: Strengthening;10 reps Heel Slides: 10 reps;Strengthening Hip ABduction/ADduction: 10 reps;Strengthening Long Arc Quad: Strengthening;10 reps Marching in Standing: Seated;Strengthening;10 reps    General Comments        Pertinent Vitals/Pain Pain Assessment: 0-10 Pain Score: 2     Home Living Family/patient expects to be discharged to:: Private residence Living Arrangements: Other relatives;Spouse/significant other Available Help at Discharge: Family Type of Home: House Home Access: Level entry   Home Layout: One level Home Equipment: Environmental consultantWalker - 2 wheels;Walker - 4 wheels      Prior Function Level of Independence: Independent with assistive device(s)      Comments: has rollater, used 24/7 for last few weeks due to pain   PT Goals (current goals can now be found in the care plan section) Acute Rehab PT Goals Patient  Stated Goal: To return home Progress towards PT goals: Progressing toward goals    Frequency    BID      PT Plan Current plan remains appropriate    Co-evaluation              AM-PAC PT "6 Clicks" Daily Activity  Outcome Measure  Difficulty turning over in bed (including adjusting bedclothes, sheets and blankets)?: None Difficulty moving from lying on back to sitting on the side of the bed? : None Difficulty sitting down on and standing up from a chair with arms (e.g., wheelchair, bedside commode, etc,.)?: None Help needed moving to and from a bed to chair (including a wheelchair)?: None Help needed walking in hospital room?: None Help needed  climbing 3-5 steps with a railing? : A Little 6 Click Score: 23    End of Session Equipment Utilized During Treatment: Gait belt Activity Tolerance: Patient tolerated treatment well Patient left: with call bell/phone within reach;with chair alarm set   PT Visit Diagnosis: Muscle weakness (generalized) (M62.81);Difficulty in walking, not elsewhere classified (R26.2)     Time: 1610-9604 PT Time Calculation (min) (ACUTE ONLY): 28 min  Charges:  $Gait Training: 8-22 mins $Therapeutic Exercise: 8-22 mins                    G Codes:       Malachi Pro, DPT 09/04/2017, 11:11 AM

## 2017-09-05 LAB — CBC
HEMATOCRIT: 32.5 % — AB (ref 35.0–47.0)
HEMOGLOBIN: 11 g/dL — AB (ref 12.0–16.0)
MCH: 30.7 pg (ref 26.0–34.0)
MCHC: 33.9 g/dL (ref 32.0–36.0)
MCV: 90.4 fL (ref 80.0–100.0)
PLATELETS: 172 10*3/uL (ref 150–440)
RBC: 3.59 MIL/uL — AB (ref 3.80–5.20)
RDW: 14.2 % (ref 11.5–14.5)
WBC: 8.7 10*3/uL (ref 3.6–11.0)

## 2017-09-05 LAB — BASIC METABOLIC PANEL
ANION GAP: 9 (ref 5–15)
BUN: 10 mg/dL (ref 6–20)
CO2: 29 mmol/L (ref 22–32)
Calcium: 8.4 mg/dL — ABNORMAL LOW (ref 8.9–10.3)
Chloride: 98 mmol/L — ABNORMAL LOW (ref 101–111)
Creatinine, Ser: 0.69 mg/dL (ref 0.44–1.00)
GFR calc Af Amer: >60 mL/min (ref >60)
GLUCOSE: 117 mg/dL — AB (ref 65–99)
POTASSIUM: 3.5 mmol/L (ref 3.5–5.1)
Sodium: 136 mmol/L (ref 135–145)

## 2017-09-05 NOTE — Progress Notes (Signed)
Patient is being discharged home with [facility]. DC & RX instructions given and patient acknowledged understanding. She had no questions. IV removed. Patient is waiting for daughter to get here to help her get ready and transport her home.

## 2017-09-05 NOTE — Progress Notes (Signed)
Daughter here to transport patient home.

## 2017-09-05 NOTE — Progress Notes (Signed)
Physical Therapy Treatment Patient Details Name: Emma Spears MRN: [id number] DOB: [birth year] Today's Date: 09/05/2017    History of Present Illness Pt. is a 62 y.o. female who was admitted to [facility] for an anterior approach Right THR. Pt. had a recent Left THR on 12/18.    PT Comments    Session was divided into two sessions due to Pt being limited by R hip pain (6-7/10; RN gave meds during session) to perform ambulation during first session. Pt demonstrated AROM exercises supine (HOB elevated) with good control (see exercises for details).  During second session, Pt ambulated 220 ft RW CGA (see ambulation for details). PT will focus on strength and activity tolerance at next session.   Follow Up Recommendations  Home health PT     Equipment Recommendations  None recommended by PT    Recommendations for Other Services       Precautions / Restrictions Precautions Precautions: Anterior Hip;Fall Restrictions Weight Bearing Restrictions: Yes RLE Weight Bearing: Weight bearing as tolerated Other Position/Activity Restrictions: RLE    Mobility  Bed Mobility Overal bed mobility: Needs Assistance Bed Mobility: Sit to Supine;Supine to Sit     Supine to sit: Min guard Sit to supine: HOB elevated;Min assist   General bed mobility comments: Supine to sit CGA; Pt required extra time and effort but demonstrated good technique: Sit to supine min assist to help raise RLE from floor to bed, Pt demonstrated good stability and technique.  Transfers Overall transfer level: Needs assistance Equipment used: Rolling walker (2 wheeled) Transfers: Sit to/from Stand Sit to Stand: Min guard         General transfer comment: Pt required RW CGA for stability w/ sit to stand from bed and toilet  Ambulation/Gait Ambulation/Gait assistance: Min guard;+2 safety/equipment Ambulation Distance (Feet): 240 Feet(20 ft, 220 ft) Assistive device: Rolling walker (2 wheeled) Gait  Pattern/deviations: Step-to pattern;Decreased stance time - right;Decreased step length - left;Decreased step length - right;Trunk flexed Gait velocity: decreased   General Gait Details: Pt reported higher level of pain this morning and stated that she could not ambulate outside of the room, so session was split into two. During second session, Pt pain meds had taken effect and pain decreased from 7/10 to 4/10; Pt stated she felt able to ambulate in the hallway; Pt ambulated w/ RW CGA 220 ft w/ a chair follow by second therapist for safety. Vc's to walker closer during turns for safety, to bring head up for improved posture, and to push through BUE to offweight RLE.   Stairs            Wheelchair Mobility    Modified Rankin (Stroke Patients Only)       Balance Overall balance assessment: Needs assistance Sitting-balance support: No upper extremity supported;Feet supported Sitting balance-Leahy Scale: Good Sitting balance - Comments: good stability when managing clothing reaching within BOS at EOB     Standing balance-Leahy Scale: Fair Standing balance comment: Pt demonstrated Fair balance when washing hands at bathroom sink                            Cognition Arousal/Alertness: Awake/alert Behavior During Therapy: WFL for tasks assessed/performed Overall Cognitive Status: Within Functional Limits for tasks assessed  Exercises Total Joint Exercises Ankle Circles/Pumps: AROM;5 reps;Strengthening;Both;Supine(HOB elevated) Quad Sets: AROM;Strengthening;5 reps;Right;Supine(HOB elevated) Gluteal Sets: Both;5 reps;Strengthening;AROM;Supine(HOB elevated) Towel Squeeze: AROM;Strengthening;Both;5 reps;Supine(HOB elevated) Short Arc Quad: AROM;Strengthening;Right;5 reps;Supine(HOB elevated) Heel Slides: AROM;Strengthening;Right;5 reps;Supine(HOB elevated) Hip ABduction/ADduction: AROM;Strengthening;Right;5  reps;Supine(HOB elevated) Straight Leg Raises: (Pt attempted but reported too much pain 6/10; RN had just given pain meds)    General Comments  Pt agreeable to session.      Pertinent Vitals/Pain Pain Assessment: 0-10 Pain Score: 5  Pain Location: R hip Pain Descriptors / Indicators: Operative site guarding;Aching;Moaning;Sore Pain Intervention(s): Limited activity within patient's tolerance;Monitored during session;Repositioned    Home Living                      Prior Function            PT Goals (current goals can now be found in the care plan section) Acute Rehab PT Goals Patient Stated Goal: To return home PT Goal Formulation: With patient Time For Goal Achievement: 09/17/17 Potential to Achieve Goals: Good Progress towards PT goals: Progressing toward goals    Frequency    BID      PT Plan Current plan remains appropriate    Co-evaluation              AM-PAC PT "6 Clicks" Daily Activity  Outcome Measure  Difficulty turning over in bed (including adjusting bedclothes, sheets and blankets)?: A Little Difficulty moving from lying on back to sitting on the side of the bed? : A Little Difficulty sitting down on and standing up from a chair with arms (e.g., wheelchair, bedside commode, etc,.)?: Unable Help needed moving to and from a bed to chair (including a wheelchair)?: A Little Help needed walking in hospital room?: A Little Help needed climbing 3-5 steps with a railing? : A Little 6 Click Score: 16    End of Session Equipment Utilized During Treatment: Gait belt Activity Tolerance: Patient limited by pain Patient left: in bed;with call bell/phone within reach;with bed alarm set;with SCD's reapplied(R heel elevated by towel roll, L heel elevated by pillow) Nurse Communication: Mobility status PT Visit Diagnosis: Muscle weakness (generalized) (M62.81);Difficulty in walking, not elsewhere classified (R26.2)     Time: [id number] 2nd session  [phone] PT Time Calculation (min) (ACUTE ONLY): 50 min  Charges:                       G Codes:        Azula Zappia Mondrian-Pardue, SPT 09/05/2017, 12:06 PM

## 2017-09-05 NOTE — Progress Notes (Signed)
   Subjective: 2 Days Post-Op Procedure(s) (LRB): TOTAL HIP ARTHROPLASTY ANTERIOR APPROACH (Right) Patient reports pain as mild.   Patient is well, and has had no acute complaints or problems.,  She denies any urinary symptoms, cough, congestion. Denies any CP, SOB, ABD pain. We will continue therapy today.  Plan is to go Home after hospital stay.  Objective: Vital signs in last 24 hours: Temp:  [98.2 F (36.8 C)-101.1 F (38.4 C)] 98.2 F (36.8 C) (03/29 2332) Pulse Rate:  [77-89] 87 (03/29 2332) Resp:  [16-18] 16 (03/29 2332) BP: (105-140)/(57-77) 105/57 (03/29 2332) SpO2:  [92 %-95 %] 95 % (03/29 2332)  Intake/Output from previous day: 03/29 0701 - 03/30 0700 In: 1320 [P.O.:1320] Out: -  Intake/Output this shift: Total I/O In: 600 [P.O.:600] Out: -   Recent Labs    09/03/17 0857 09/03/17 1401 09/04/17 0427 09/05/17 0352  HGB 13.6 12.5 11.6* 11.0*   Recent Labs    09/04/17 0427 09/05/17 0352  WBC 10.3 8.7  RBC 3.89 3.59*  HCT 35.1 32.5*  PLT 206 172   Recent Labs    09/04/17 0427 09/05/17 0352  NA 137 136  K 3.8 3.5  CL 103 98*  CO2 27 29  BUN 16 10  CREATININE 0.74 0.69  GLUCOSE 122* 117*  CALCIUM 8.5* 8.4*   No results for input(s): LABPT, INR in the last 72 hours.  EXAM General - Patient is Alert, Appropriate and Oriented Extremity - Sensation intact distally Intact pulses distally Dorsiflexion/Plantar flexion intact Incision: dressing C/D/I and no drainage No cellulitis present Compartment soft Dressing - dressing C/D/I and no drainage.  Wound VAC intact with no drainage.  Mild ecchymosis noted along the distal incision site.  Thigh is soft. Motor Function - intact, moving foot and toes well on exam.   Past Medical History:  Diagnosis Date  . Anxiety   . Arthritis   . Depression     Assessment/Plan:   2 Days Post-Op Procedure(s) (LRB): TOTAL HIP ARTHROPLASTY ANTERIOR APPROACH (Right) Active Problems:   Status post total hip  replacement, right  Estimated body mass index is 33.66 kg/m as calculated from the following:   Height as of this encounter: 5\' 3"  (1.6 m).   Weight as of this encounter: 86.2 kg (190 lb). Advance diet Up with therapy  Needs bowel movement Incision appears well with no signs of infection. Care management to assist with discharge today   DVT Prophylaxis - Lovenox, Foot Pumps and TED hose Weight-Bearing as tolerated to right leg   Dedra Skeensodd Altair Stanko PA-C [facility] 09/05/2017, 6:02 AM

## 2017-09-07 LAB — SURGICAL PATHOLOGY

## 2017-09-09 ENCOUNTER — Emergency Department: Payer: Medicare Other

## 2017-09-09 ENCOUNTER — Emergency Department
Admission: EM | Admit: 2017-09-09 | Discharge: 2017-09-09 | Disposition: A | Discharge status: Home / Self Care | Payer: Medicare Other | Attending: Emergency Medicine | Admitting: Emergency Medicine

## 2017-09-09 ENCOUNTER — Other Ambulatory Visit: Payer: Self-pay

## 2017-09-09 ENCOUNTER — Encounter: Payer: Self-pay | Admitting: *Deleted

## 2017-09-09 DIAGNOSIS — Z7901 Long term (current) use of anticoagulants: Secondary | ICD-10-CM | POA: Insufficient documentation

## 2017-09-09 DIAGNOSIS — W19XXXA Unspecified fall, initial encounter: Secondary | ICD-10-CM

## 2017-09-09 DIAGNOSIS — G8911 Acute pain due to trauma: Secondary | ICD-10-CM | POA: Insufficient documentation

## 2017-09-09 DIAGNOSIS — Z87891 Personal history of nicotine dependence: Secondary | ICD-10-CM | POA: Insufficient documentation

## 2017-09-09 DIAGNOSIS — Y9389 Activity, other specified: Secondary | ICD-10-CM | POA: Insufficient documentation

## 2017-09-09 DIAGNOSIS — Y999 Unspecified external cause status: Secondary | ICD-10-CM | POA: Insufficient documentation

## 2017-09-09 DIAGNOSIS — Z79899 Other long term (current) drug therapy: Secondary | ICD-10-CM | POA: Insufficient documentation

## 2017-09-09 DIAGNOSIS — Y92018 Other place in single-family (private) house as the place of occurrence of the external cause: Secondary | ICD-10-CM | POA: Insufficient documentation

## 2017-09-09 DIAGNOSIS — S8991XA Unspecified injury of right lower leg, initial encounter: Secondary | ICD-10-CM | POA: Diagnosis present

## 2017-09-09 DIAGNOSIS — Z96641 Presence of right artificial hip joint: Secondary | ICD-10-CM | POA: Insufficient documentation

## 2017-09-09 DIAGNOSIS — M25561 Pain in right knee: Secondary | ICD-10-CM | POA: Insufficient documentation

## 2017-09-09 DIAGNOSIS — W1839XA Other fall on same level, initial encounter: Secondary | ICD-10-CM | POA: Insufficient documentation

## 2017-09-09 DIAGNOSIS — Y92009 Unspecified place in unspecified non-institutional (private) residence as the place of occurrence of the external cause: Secondary | ICD-10-CM

## 2017-09-09 LAB — BASIC METABOLIC PANEL
Anion gap: 9 (ref 5–15)
BUN: 9 mg/dL (ref 6–20)
CALCIUM: 9 mg/dL (ref 8.9–10.3)
CO2: 33 mmol/L — ABNORMAL HIGH (ref 22–32)
CREATININE: 0.79 mg/dL (ref 0.44–1.00)
Chloride: 99 mmol/L — ABNORMAL LOW (ref 101–111)
GFR calc Af Amer: >60 mL/min (ref >60)
Glucose, Bld: 130 mg/dL — ABNORMAL HIGH (ref 65–99)
Potassium: 3.2 mmol/L — ABNORMAL LOW (ref 3.5–5.1)
Sodium: 141 mmol/L (ref 135–145)

## 2017-09-09 LAB — CBC
HCT: 34.1 % — ABNORMAL LOW (ref 35.0–47.0)
Hemoglobin: 11.4 g/dL — ABNORMAL LOW (ref 12.0–16.0)
MCH: 30.5 pg (ref 26.0–34.0)
MCHC: 33.3 g/dL (ref 32.0–36.0)
MCV: 91.6 fL (ref 80.0–100.0)
PLATELETS: 250 10*3/uL (ref 150–440)
RBC: 3.73 MIL/uL — ABNORMAL LOW (ref 3.80–5.20)
RDW: 14.1 % (ref 11.5–14.5)
WBC: 6.5 10*3/uL (ref 3.6–11.0)

## 2017-09-09 LAB — TROPONIN I: Troponin I: <0.03 ng/mL (ref <0.03)

## 2017-09-09 MED ORDER — KETOROLAC TROMETHAMINE 60 MG/2ML IM SOLN
60.0000 mg | Freq: Once | INTRAMUSCULAR | Status: AC
  Administered 2017-09-09: 60 mg via INTRAMUSCULAR
  Filled 2017-09-09: qty 2

## 2017-09-09 NOTE — ED Triage Notes (Signed)
Pt brought in via ems from home with a fall.  Pt has right knee pain.  Pt had right hip replacement by dr Rosita Keamenz recently.  Pt alert.  md at bedside.

## 2017-09-09 NOTE — Discharge Instructions (Addendum)
Please follow up with your orthopedic physician for further evaluation of your knee pain.

## 2017-09-09 NOTE — ED Notes (Signed)
Pt states she was walking without her walker inside the house and fell onto the floor.  Pt has right knee pain.  No deformity noted.  Pt had right hip replacement recently.   No loc.  Pt alert on arrival   Er md at bedside.

## 2017-09-09 NOTE — ED Notes (Signed)
ED Provider at bedside. 

## 2017-09-09 NOTE — ED Provider Notes (Signed)
[facility] Emergency Department Provider Note   ____________________________________________   First MD Initiated Contact with Patient 09/09/17 [phone]     (approximate)  I have reviewed the triage vital signs and the nursing notes.   HISTORY  Chief Complaint Fall and Knee Pain    HPI Emma Spears is a 62 y.o. female who comes into the hospital today with some right-sided knee pain.  The patient had a right hip replacement on 28 March.  She was discharged home and has a walker.  The patient thinks that she was asleep tonight and walking without her walker.  The patient fell from standing onto her right knee.  She is having some pain to her right lateral knee.  She has color motion and sensation intact and she has a wound VAC on her hip.  The patient's orthopedic surgeon is Dr. Rosita Kea.  The patient has been taking a blood thinner.  She is here for evaluation.  She rates her pain a 7 out of 10 in intensity.   Past Medical History:  Diagnosis Date  . Anxiety   . Arthritis   . Depression     Patient Active Problem List   Diagnosis Date Noted  . Status post total hip replacement, right 09/03/2017  . Primary localized osteoarthritis of left hip 05/28/2017    Past Surgical History:  Procedure Laterality Date  . ABDOMINAL HYSTERECTOMY N/A 1981  . BACK SURGERY N/A  2 in 2001, 2003,  2 in 2005   herniated discs, implant repair work  . CHOLECYSTECTOMY    . GALLBLADDER SURGERY N/A 09/1975  . JOINT REPLACEMENT Left 2018   total hip  . SPINAL CORD STIMULATOR IMPLANT Right 2003  . TOTAL HIP ARTHROPLASTY Left 05/28/2017   Procedure: TOTAL HIP ARTHROPLASTY ANTERIOR APPROACH;  Surgeon: Kennedy Bucker, MD;  Location: [facility] ORS;  Service: Orthopedics;  Laterality: Left;  . TOTAL HIP ARTHROPLASTY Right 09/03/2017   Procedure: TOTAL HIP ARTHROPLASTY ANTERIOR APPROACH;  Surgeon: Kennedy Bucker, MD;  Location: [facility] ORS;  Service: Orthopedics;  Laterality: Right;     Prior to Admission medications   Medication Sig Start Date End Date Taking? Authorizing Provider  acetaminophen (TYLENOL) 325 MG tablet Take 1-2 tablets (325-650 mg total) by mouth every 6 (six) hours as needed for mild pain (pain score 1-3 or temp > 100.5). 09/04/17   Evon Slack, PA-C  docusate sodium (COLACE) 100 MG capsule Take 1 capsule (100 mg total) by mouth 2 (two) times daily. 09/04/17   Evon Slack, PA-C  enoxaparin (LOVENOX) 40 MG/0.4ML injection Inject 0.4 mLs (40 mg total) into the skin daily for 14 days. 09/05/17 09/19/17  Evon Slack, PA-C  escitalopram (LEXAPRO) 20 MG tablet Take 20 mg by mouth daily.    [provider]  gabapentin (NEURONTIN) 300 MG capsule Take 300 mg by mouth 3 (three) times daily. 300mg  breakfast, 300mg  lunch, 600mg  bedtime    [provider]  indomethacin (INDOCIN) 25 MG capsule Take 25 mg by mouth 2 (two) times daily with a meal.    [provider]  loratadine (CLARITIN) 10 MG tablet Take 10 mg by mouth daily as needed for allergies.    [provider]  methocarbamol (ROBAXIN) 500 MG tablet Take 1 tablet (500 mg total) by mouth every 6 (six) hours as needed for muscle spasms. 09/04/17   Evon Slack, PA-C  morphine (MS CONTIN) 15 MG 12 hr tablet Take 1 tablet by mouth 3 (three)  times daily. 05/09/17   [provider]  Multiple Vitamin (MULTIVITAMIN WITH MINERALS) TABS tablet Take 1 tablet by mouth daily.    [provider]  naloxone (NARCAN) nasal spray 4 mg/0.1 mL 1 SPRAY RIGHT NOSTRIL ONCE IF NEED. IF FOUND UNRESPONSIVE, CALL 911 CAN REPEAT IN 5 MIN UP TO 2 DOSE 05/12/17   [provider]  oxyCODONE 10 MG TABS Take 1-1.5 tablets (10-15 mg total) by mouth every 4 (four) hours as needed for severe pain (pain score 7-10). 09/04/17   Evon SlackGaines, Thomas C, PA-C    Allergies Vicodin [hydrocodone-acetaminophen]  Family History  Problem Relation Age of Onset  . Breast cancer Sister 10053  .  Liver cancer Paternal Aunt   . Diabetes Mother   . Hypertension Mother   . Diabetes Father   . Hypertension Father     Social History Social History   Tobacco Use  . Smoking status: Former Smoker    Packs/day: 2.00    Types: Cigarettes    Last attempt to quit: 07/23/2011    Years since quitting: 6.1  . Smokeless tobacco: Never Used  Substance Use Topics  . Alcohol use: No    Frequency: Never  . Drug use: No    Review of Systems  Constitutional: No fever/chills Eyes: No visual changes. ENT: No sore throat. Cardiovascular: Denies chest pain. Respiratory: Denies shortness of breath. Gastrointestinal: No abdominal pain.  No nausea, no vomiting.  No diarrhea.  No constipation. Genitourinary: Negative for dysuria. Musculoskeletal: Right knee pain, right groin pain Skin: Negative for rash. Neurological: Negative for headaches, focal weakness or numbness.   ____________________________________________   PHYSICAL EXAM:  VITAL SIGNS: ED Triage Vitals  Enc Vitals Group     BP 09/09/17 0053 (!) 164/98     Pulse Rate 09/09/17 0053 96     Resp 09/09/17 0053 20     Temp 09/09/17 0053 98.7 F (37.1 C)     Temp Source 09/09/17 0053 Oral     SpO2 09/09/17 0053 98 %     Weight 09/09/17 0054 190 lb (86.2 kg)     Height 09/09/17 0054 5\' 3"  (1.6 m)     Head Circumference --      Peak Flow --      Pain Score 09/09/17 0054 8     Pain Loc --      Pain Edu? --      Excl. in GC? --    Constitutional: Alert and oriented. Well appearing and in mild distress. Eyes: Conjunctivae are normal. PERRL. EOMI. Head: Atraumatic. Nose: No congestion/rhinnorhea. Mouth/Throat: Mucous membranes are moist.  Oropharynx non-erythematous. Neck: No cervical spine tenderness to palpation Cardiovascular: Normal rate, regular rhythm. Grossly normal heart sounds.  Good peripheral circulation. Respiratory: Normal respiratory effort.  No retractions. Lungs CTAB. Gastrointestinal: Soft and nontender. No  distention. Positive bowel sounds Musculoskeletal: Right knee with some mild tenderness to palpation and some mild soft tissue swelling.  No left hip tenderness to palpation  Neurologic:  Normal speech and language.  Skin:  Skin is warm, dry and intact. No rash noted. Psychiatric: Mood and affect are normal. Speech and behavior are normal.  ____________________________________________   LABS (all labs ordered are listed, but only abnormal results are displayed)  Labs Reviewed  CBC - Abnormal; Notable for the following components:      Result Value   RBC 3.73 (*)    Hemoglobin 11.4 (*)    HCT 34.1 (*)    All other components  within normal limits  BASIC METABOLIC PANEL - Abnormal; Notable for the following components:   Potassium 3.2 (*)    Chloride 99 (*)    CO2 33 (*)    Glucose, Bld 130 (*)    All other components within normal limits  TROPONIN I   ____________________________________________  EKG  none ____________________________________________  RADIOLOGY  ED MD interpretation: Right knee x-ray: Mild to moderate arthritis of the knee with small suprapatellar effusion, no definite acute displaced fracture seen  Right hip x-ray: Status post bilateral hip replacements without dislocation, linear lucencies overlying the right trochanter are questionable for nondisplaced fracture versus soft tissue artifact.  Official radiology report(s): Dg Knee Complete 4 Views Right  Result Date: 09/09/2017 CLINICAL DATA:  Fall with knee pain EXAM: RIGHT KNEE - COMPLETE 4+ VIEW COMPARISON:  None. FINDINGS: No acute displaced fracture or malalignment. Mild patellofemoral degenerative change with bony spurring. Small knee effusion. Mild to moderate medial and mild lateral joint space compartment degenerative change. IMPRESSION: 1. Mild to moderate arthritis of the knee with small suprapatellar effusion. No definite acute displaced fracture is seen Electronically Signed   By: Jasmine Pang  M.D.   On: 09/09/2017 01:32   Dg Hip Unilat W Or Wo Pelvis 2-3 Views Right  Result Date: 09/09/2017 CLINICAL DATA:  Fall with knee pain EXAM: DG HIP (WITH OR WITHOUT PELVIS) 2-3V RIGHT COMPARISON:  09/01/2017 FINDINGS: Right lower quadrant generator. SI joints are symmetric. Pubic symphysis and rami are intact. Prior left hip replacement with normal alignment. Status post right hip replacement without dislocation. Linear lucencies over the trochanter in one view. Cutaneous staples over the right hip. IMPRESSION: 1. Status post bilateral hip replacements without dislocation 2. Linear lucencies overlying the right trochanter are questionable for nondisplaced fracture versus soft tissue artifact. Electronically Signed   By: Jasmine Pang M.D.   On: 09/09/2017 01:31    ____________________________________________   PROCEDURES  Procedure(s) performed: None  Procedures  Critical Care performed: No  ____________________________________________   INITIAL IMPRESSION / ASSESSMENT AND PLAN / ED COURSE  As part of my medical decision making, I reviewed the following data within the electronic MEDICAL RECORD NUMBER Notes from prior ED visits and [place] Controlled Substance Database   This is a 62 year old female who comes into the hospital today after a fall tonight.  The patient's family members in the room and states that the patient has been sleepwalking and that is likely how she fell.  She asked if there was something I could give the patient to help her stop sleepwalking but I told her to discuss it with her primary care physician.  The family states that the patient has sleepwalking the more medication she takes.  I informed the patient of the results of the x-ray.  We will place her in a knee immobilizer.  She will be discharged home to follow-up with orthopedic surgery.  The patient does have some medication for pain at home.  She should return with any worsening symptoms or any other concerns.       ____________________________________________   FINAL CLINICAL IMPRESSION(S) / ED DIAGNOSES  Final diagnoses:  Acute pain of right knee  Fall in home, initial encounter     ED Discharge Orders    None       Note:  This document was prepared using Dragon voice recognition software and may include unintentional dictation errors.    Rebecka Apley, MD 09/09/17 [phone]

## 2017-09-09 NOTE — ED Notes (Signed)
Report off to sherie rn 

## 2017-12-15 ENCOUNTER — Other Ambulatory Visit: Payer: Self-pay | Admitting: Internal Medicine

## 2017-12-15 DIAGNOSIS — Z1231 Encounter for screening mammogram for malignant neoplasm of breast: Secondary | ICD-10-CM

## 2018-02-09 ENCOUNTER — Telehealth: Payer: Self-pay | Admitting: *Deleted

## 2018-02-09 ENCOUNTER — Encounter: Payer: Self-pay | Admitting: *Deleted

## 2018-02-09 DIAGNOSIS — Z122 Encounter for screening for malignant neoplasm of respiratory organs: Secondary | ICD-10-CM

## 2018-02-09 NOTE — Telephone Encounter (Signed)
Received a referral for initial lung cancer screening scan.  Contacted the patient and obtained their smoking history, former smoker quit 07-22-10 with a 60pkyr, as well as answering questions related to screening process.  Patient denies signs of lung cancer such as weight loss or hemoptysis at this time.  Patient denies comorbidity that would prevent curative treatment if lung cancer were found.  Patient is scheduled for the Shared Decision Making Visit and CT scan on 03-02-18@1330 .

## 2018-03-02 ENCOUNTER — Ambulatory Visit: Admission: RE | Admit: 2018-03-02 | Payer: Medicare Other | Source: Ambulatory Visit

## 2018-03-02 ENCOUNTER — Inpatient Hospital Stay: Payer: Medicare Other | Attending: Oncology | Admitting: Oncology

## 2018-03-04 ENCOUNTER — Telehealth: Payer: Self-pay | Admitting: *Deleted

## 2018-03-04 NOTE — Telephone Encounter (Signed)
Received a referral for initial lung cancer screening scan.  Contacted the patient and obtained their smoking history, former smoker quit 2012 with a 60pkyr history   as well as answering questions related to screening process.  Patient denies signs of lung cancer such as weight loss or hemoptysis at this time.  Patient denies comorbidity that would prevent curative treatment if lung cancer were found.  Patient is scheduled for the Shared Decision Making Visit and CT scan on 04-06-18@1315 .

## 2018-03-25 ENCOUNTER — Ambulatory Visit: Payer: Medicare Other | Admitting: Oncology

## 2018-04-06 ENCOUNTER — Inpatient Hospital Stay: Payer: Medicare Other | Admitting: Nurse Practitioner

## 2018-04-06 ENCOUNTER — Ambulatory Visit: Admission: RE | Admit: 2018-04-06 | Payer: Medicare Other | Source: Ambulatory Visit

## 2018-04-07 ENCOUNTER — Telehealth: Payer: Self-pay | Admitting: *Deleted

## 2018-04-07 NOTE — Telephone Encounter (Signed)
Patient was a no show on 04-06-18 for ldct screening.  ATtempted to call pt and reschedule, unable to reach, voice mail left for her to call back to [phone] for new appt time

## 2018-04-08 ENCOUNTER — Telehealth: Payer: Self-pay | Admitting: *Deleted

## 2018-04-08 NOTE — Telephone Encounter (Signed)
Attempted to contact patient r/t LDCT Screening follow up due at this time.  No answer received, message left for patient to call [phone] to schedule appointment.    

## 2018-04-09 ENCOUNTER — Encounter: Payer: Self-pay | Admitting: *Deleted

## 2018-04-09 ENCOUNTER — Telehealth: Payer: Self-pay | Admitting: *Deleted

## 2018-04-09 NOTE — Telephone Encounter (Signed)
Received referral for low dose lung cancer screening CT scan.  Message left at phone number listed in EMR for patient to call either myself or Shawn Perkins back at [phone] to facilitate scheduling the scan.    

## 2018-07-22 ENCOUNTER — Other Ambulatory Visit: Payer: Self-pay | Admitting: Internal Medicine

## 2018-07-22 DIAGNOSIS — Z1231 Encounter for screening mammogram for malignant neoplasm of breast: Secondary | ICD-10-CM

## 2018-07-23 ENCOUNTER — Other Ambulatory Visit: Payer: Self-pay

## 2018-07-23 DIAGNOSIS — Z1211 Encounter for screening for malignant neoplasm of colon: Secondary | ICD-10-CM

## 2018-08-03 ENCOUNTER — Telehealth: Payer: Self-pay | Admitting: Gastroenterology

## 2018-08-03 NOTE — Telephone Encounter (Signed)
Pt left vm she needs to r/s her apt due to strep throat going around in her family and not having a driver

## 2018-08-03 NOTE — Telephone Encounter (Signed)
Colonoscopy has been rescheduled from 08/06/18 with Dr. Maximino Greenland at [facility] to Dr. Tobi Bastos on 08/13/18 at [facility].  Pt rescheduled due to the flu.  No Cancellation Fee.  Thanks [employer]

## 2018-08-03 NOTE — Telephone Encounter (Signed)
Pt left vm she needs to r/s her procedure for 08/06/18 due to having another apt that day please call to r/s

## 2018-08-11 ENCOUNTER — Telehealth: Payer: Self-pay

## 2018-08-11 NOTE — Telephone Encounter (Signed)
Patient has been contacted in regards to [facility]  Terminations of [employer] provider agreements.  Explained to patient that her anesthesia portion of colonoscopy would not be covered by insurance.  She has requested to cancel the colonoscopy scheduled for 08/13/18 and plans on calling the [employer] to get more details of the change.  Thanks [employer]

## 2018-08-13 ENCOUNTER — Encounter: Admission: RE | Payer: Self-pay | Source: Home / Self Care

## 2018-08-13 ENCOUNTER — Ambulatory Visit: Admission: RE | Admit: 2018-08-13 | Payer: Medicare Other | Source: Home / Self Care | Admitting: Gastroenterology

## 2018-08-13 SURGERY — COLONOSCOPY WITH PROPOFOL
Anesthesia: General

## 2019-01-24 ENCOUNTER — Telehealth: Payer: Self-pay

## 2019-01-24 ENCOUNTER — Other Ambulatory Visit: Payer: Self-pay

## 2019-01-24 DIAGNOSIS — Z1211 Encounter for screening for malignant neoplasm of colon: Secondary | ICD-10-CM

## 2019-01-24 DIAGNOSIS — Z86010 Personal history of colonic polyps: Secondary | ICD-10-CM

## 2019-01-24 NOTE — Telephone Encounter (Signed)
Gastroenterology Pre-Procedure Review  Request Date: 02/15/19 Requesting Physician: Dr. Vicente Males  PATIENT REVIEW QUESTIONS: The patient responded to the following health history questions as indicated:    1. Are you having any GI issues? no 2. Do you have a personal history of Polyps? yes (several years ago) 3. Do you have a family history of Colon Cancer or Polyps? no 4. Diabetes Mellitus? no 5. Joint replacements in the past 12 months?no 6. Major health problems in the past 3 months?no 7. Any artificial heart valves, MVP, or defibrillator?no    MEDICATIONS & ALLERGIES:    Patient reports the following regarding taking any anticoagulation/antiplatelet therapy:   Plavix, Coumadin, Eliquis, Xarelto, Lovenox, Pradaxa, Brilinta, or Effient? no pt states she does not take Lovenox anymore, and takes tylenol 325 on as needed Aspirin? no  Patient confirms/reports the following medications:  Current Outpatient Medications  Medication Sig Dispense Refill  . acetaminophen (TYLENOL) 325 MG tablet Take 1-2 tablets (325-650 mg total) by mouth every 6 (six) hours as needed for mild pain (pain score 1-3 or temp > 100.5).    Marland Kitchen docusate sodium (COLACE) 100 MG capsule Take 1 capsule (100 mg total) by mouth 2 (two) times daily. 10 capsule 0  . enoxaparin (LOVENOX) 40 MG/0.4ML injection Inject 0.4 mLs (40 mg total) into the skin daily for 14 days. 14 Syringe 0  . escitalopram (LEXAPRO) 20 MG tablet Take 20 mg by mouth daily.    Marland Kitchen gabapentin (NEURONTIN) 300 MG capsule Take 300 mg by mouth 3 (three) times daily. 300mg  breakfast, 300mg  lunch, 600mg  bedtime    . indomethacin (INDOCIN) 25 MG capsule Take 25 mg by mouth 2 (two) times daily with a meal.    . loratadine (CLARITIN) 10 MG tablet Take 10 mg by mouth daily as needed for allergies.    . methocarbamol (ROBAXIN) 500 MG tablet Take 1 tablet (500 mg total) by mouth every 6 (six) hours as needed for muscle spasms. 30 tablet 0  . morphine (MS CONTIN) 15 MG 12  hr tablet Take 1 tablet by mouth 3 (three) times daily.  0  . Multiple Vitamin (MULTIVITAMIN WITH MINERALS) TABS tablet Take 1 tablet by mouth daily.    . naloxone (NARCAN) nasal spray 4 mg/0.1 mL 1 SPRAY RIGHT NOSTRIL ONCE IF NEED. IF FOUND UNRESPONSIVE, CALL 911 CAN REPEAT IN 5 MIN UP TO 2 DOSE    . oxyCODONE 10 MG TABS Take 1-1.5 tablets (10-15 mg total) by mouth every 4 (four) hours as needed for severe pain (pain score 7-10). 30 tablet 0   No current facility-administered medications for this visit.     Patient confirms/reports the following allergies:  Allergies  Allergen Reactions  . Vicodin [Hydrocodone-Acetaminophen] Nausea And Vomiting    No orders of the defined types were placed in this encounter.   AUTHORIZATION INFORMATION Primary Insurance: 1D#: Group #:  Secondary Insurance: 1D#: Group #:  SCHEDULE INFORMATION: Date: 02/15/19 Time: Location:[facility]

## 2019-02-11 ENCOUNTER — Encounter: Payer: Self-pay | Admitting: *Deleted

## 2019-02-11 ENCOUNTER — Other Ambulatory Visit: Admission: RE | Admit: 2019-02-11 | Payer: Medicare Other | Source: Ambulatory Visit

## 2019-02-15 ENCOUNTER — Encounter: Admission: RE | Payer: Self-pay | Source: Home / Self Care

## 2019-02-15 ENCOUNTER — Ambulatory Visit: Admission: RE | Admit: 2019-02-15 | Payer: Medicare Other | Source: Home / Self Care | Admitting: Gastroenterology

## 2019-02-15 ENCOUNTER — Telehealth: Payer: Self-pay

## 2019-02-15 SURGERY — COLONOSCOPY WITH PROPOFOL
Anesthesia: General

## 2019-02-15 NOTE — Telephone Encounter (Signed)
LVM asking patient to call back to reschedule colonoscopy due to COVID testing not being completed.   Thanks,  Sharyn Lull

## 2020-10-02 ENCOUNTER — Other Ambulatory Visit: Payer: Self-pay | Admitting: Internal Medicine

## 2020-10-02 DIAGNOSIS — Z1231 Encounter for screening mammogram for malignant neoplasm of breast: Secondary | ICD-10-CM

## 2022-03-07 ENCOUNTER — Other Ambulatory Visit: Payer: Self-pay | Admitting: Student

## 2022-03-07 DIAGNOSIS — G8929 Other chronic pain: Secondary | ICD-10-CM

## 2022-03-07 DIAGNOSIS — M19012 Primary osteoarthritis, left shoulder: Secondary | ICD-10-CM

## 2022-03-07 DIAGNOSIS — M7582 Other shoulder lesions, left shoulder: Secondary | ICD-10-CM

## 2022-03-21 ENCOUNTER — Ambulatory Visit
Admission: RE | Admit: 2022-03-21 | Discharge: 2022-03-21 | Disposition: A | Discharge status: Home / Self Care | Payer: Medicare HMO | Source: Ambulatory Visit | Attending: Student | Admitting: Student

## 2022-03-21 DIAGNOSIS — M25512 Pain in left shoulder: Secondary | ICD-10-CM | POA: Diagnosis not present

## 2022-03-21 DIAGNOSIS — M7582 Other shoulder lesions, left shoulder: Secondary | ICD-10-CM | POA: Insufficient documentation

## 2022-03-21 DIAGNOSIS — M19012 Primary osteoarthritis, left shoulder: Secondary | ICD-10-CM | POA: Insufficient documentation

## 2022-03-21 DIAGNOSIS — G8929 Other chronic pain: Secondary | ICD-10-CM | POA: Insufficient documentation

## 2022-05-12 ENCOUNTER — Other Ambulatory Visit: Payer: Self-pay | Admitting: Surgery

## 2022-05-13 ENCOUNTER — Other Ambulatory Visit: Payer: Self-pay

## 2022-05-13 ENCOUNTER — Encounter
Admission: RE | Admit: 2022-05-13 | Discharge: 2022-05-13 | Disposition: A | Discharge status: Home / Self Care | Payer: Medicare HMO | Source: Ambulatory Visit | Attending: Surgery | Admitting: Surgery

## 2022-05-13 DIAGNOSIS — R8290 Unspecified abnormal findings in urine: Secondary | ICD-10-CM | POA: Diagnosis not present

## 2022-05-13 DIAGNOSIS — Z01810 Encounter for preprocedural cardiovascular examination: Secondary | ICD-10-CM

## 2022-05-13 DIAGNOSIS — Z01818 Encounter for other preprocedural examination: Secondary | ICD-10-CM | POA: Diagnosis present

## 2022-05-13 DIAGNOSIS — Z01812 Encounter for preprocedural laboratory examination: Secondary | ICD-10-CM

## 2022-05-13 HISTORY — DX: Essential (primary) hypertension: I10

## 2022-05-13 LAB — CBC WITH DIFFERENTIAL/PLATELET
Abs Immature Granulocytes: 0.03 10*3/uL (ref 0.00–0.07)
Basophils Absolute: 0.1 10*3/uL (ref 0.0–0.1)
Basophils Relative: 1 %
Eosinophils Absolute: 0.1 10*3/uL (ref 0.0–0.5)
Eosinophils Relative: 1 %
HCT: 38 % (ref 36.0–46.0)
Hemoglobin: 12.9 g/dL (ref 12.0–15.0)
Immature Granulocytes: 0 %
Lymphocytes Relative: 31 %
Lymphs Abs: 2.3 10*3/uL (ref 0.7–4.0)
MCH: 32.2 pg (ref 26.0–34.0)
MCHC: 33.9 g/dL (ref 30.0–36.0)
MCV: 94.8 fL (ref 80.0–100.0)
Monocytes Absolute: 0.5 10*3/uL (ref 0.1–1.0)
Monocytes Relative: 7 %
Neutro Abs: 4.6 10*3/uL (ref 1.7–7.7)
Neutrophils Relative %: 60 %
Platelets: 204 10*3/uL (ref 150–400)
RBC: 4.01 MIL/uL (ref 3.87–5.11)
RDW: 12.9 % (ref 11.5–15.5)
WBC: 7.6 10*3/uL (ref 4.0–10.5)
nRBC: 0 % (ref 0.0–0.2)

## 2022-05-13 LAB — COMPREHENSIVE METABOLIC PANEL
ALT: 11 U/L (ref 0–44)
AST: 15 U/L (ref 15–41)
Albumin: 4 g/dL (ref 3.5–5.0)
Alkaline Phosphatase: 78 U/L (ref 38–126)
Anion gap: 7 (ref 5–15)
BUN: 39 mg/dL — ABNORMAL HIGH (ref 8–23)
CO2: 25 mmol/L (ref 22–32)
Calcium: 9.2 mg/dL (ref 8.9–10.3)
Chloride: 105 mmol/L (ref 98–111)
Creatinine, Ser: 1.19 mg/dL — ABNORMAL HIGH (ref 0.44–1.00)
GFR, Estimated: 50 mL/min — ABNORMAL LOW (ref >60)
Glucose, Bld: 82 mg/dL (ref 70–99)
Potassium: 3.7 mmol/L (ref 3.5–5.1)
Sodium: 137 mmol/L (ref 135–145)
Total Bilirubin: 0.7 mg/dL (ref 0.3–1.2)
Total Protein: 7.2 g/dL (ref 6.5–8.1)

## 2022-05-13 LAB — TYPE AND SCREEN
ABO/RH(D): O POS
Antibody Screen: NEGATIVE

## 2022-05-13 LAB — SURGICAL PCR SCREEN
MRSA, PCR: NEGATIVE
Staphylococcus aureus: NEGATIVE

## 2022-05-13 LAB — URINALYSIS, ROUTINE W REFLEX MICROSCOPIC
Bilirubin Urine: NEGATIVE
Glucose, UA: NEGATIVE mg/dL
Hgb urine dipstick: NEGATIVE
Ketones, ur: NEGATIVE mg/dL
Nitrite: NEGATIVE
Protein, ur: NEGATIVE mg/dL
Specific Gravity, Urine: 1.016 (ref 1.005–1.030)
pH: 5 (ref 5.0–8.0)

## 2022-05-13 NOTE — Patient Instructions (Addendum)
Your procedure is scheduled on: 05/22/22 - Thursday Report to the Registration Desk on the 1st floor of the [facility]. To find out your arrival time, please call [phone] between 1PM - 3PM on: 05/21/22 - Wednesday If your arrival time is 6:00 am, do not arrive prior to that time as the [facility] entrance doors do not open until 6:00 am.  REMEMBER: Instructions that are not followed completely may result in serious medical risk, up to and including death; or upon the discretion of your surgeon and anesthesiologist your surgery may need to be rescheduled.  Do not eat food after midnight the night before surgery.  No gum chewing, lozengers or hard candies.  You may however, drink CLEAR liquids up to 2 hours before you are scheduled to arrive for your surgery. Do not drink anything within 2 hours of your scheduled arrival time.  Clear liquids include: - water  - apple juice without pulp - gatorade (not RED colors) - black coffee or tea (Do NOT add milk or creamers to the coffee or tea) Do NOT drink anything that is not on this list.  TAKE THESE MEDICATIONS THE MORNING OF SURGERY WITH A SIP OF WATER: - escitalopram (LEXAPRO)    Use Benzoyl Peroxide ( Acne Cream) as directed.  One week prior to surgery: Stop Anti-inflammatories (NSAIDS) such as Advil, Aleve, Ibuprofen, Motrin, Naproxen, Naprosyn and Aspirin based products such as Excedrin, Goodys Powder, BC Powder.  Stop ANY OVER THE COUNTER supplements until after surgery.  You may take Tylenol if needed for pain up until the day of surgery.  No Alcohol for 24 hours before or after surgery.  No Smoking including e-cigarettes for 24 hours prior to surgery.  No chewable tobacco products for at least 6 hours prior to surgery.  No nicotine patches on the day of surgery.  Do not use any "recreational" drugs for at least a week prior to your surgery.  Please be advised that the combination of cocaine and anesthesia may have  negative outcomes, up to and including death. If you test positive for cocaine, your surgery will be cancelled.  On the morning of surgery brush your teeth with toothpaste and water, you may rinse your mouth with mouthwash if you wish. Do not swallow any toothpaste or mouthwash.  Use CHG Soap or wipes as directed on instruction sheet.  Do not wear jewelry, make-up, hairpins, clips or nail polish.  Do not wear lotions, powders, or perfumes.   Do not shave body from the neck down 48 hours prior to surgery just in case you cut yourself which could leave a site for infection. Also, freshly shaved skin may become irritated if using the CHG soap.  Contact lenses, hearing aids and dentures may not be worn into surgery.  Do not bring valuables to the hospital. [facility] is not responsible for any missing/lost belongings or valuables.   Notify your doctor if there is any change in your medical condition (cold, fever, infection).  Wear comfortable clothing (specific to your surgery type) to the hospital.  After surgery, you can help prevent lung complications by doing breathing exercises.  Take deep breaths and cough every 1-2 hours. Your doctor may order a device called an Incentive Spirometer to help you take deep breaths. When coughing or sneezing, hold a pillow firmly against your incision with both hands. This is called "splinting." Doing this helps protect your incision. It also decreases belly discomfort.  If you are being admitted to  the hospital overnight, leave your suitcase in the car. After surgery it may be brought to your room.  If you are being discharged the day of surgery, you will not be allowed to drive home. You will need a responsible adult (18 years or older) to drive you home and stay with you that night.   If you are taking public transportation, you will need to have a responsible adult (18 years or older) with you. Please confirm with your physician that it is  acceptable to use public transportation.   Please call the Pre-admissions Testing Dept. at [phone] if you have any questions about these instructions.  Surgery Visitation Policy:  Patients undergoing a surgery or procedure may have two family members or support persons with them as long as the person is not COVID-19 positive or experiencing its symptoms.   Inpatient Visitation:    Visiting hours are 7 a.m. to 8 p.m. Up to four visitors are allowed at one time in a patient room. The visitors may rotate out with other people during the day. One designated support person (adult) may remain overnight.  MASKING: Due to an increase in RSV rates and hospitalizations, in-patient care areas in which we serve newborns, infants and children, masks will be required for teammates and visitors.  Children ages 29 and under may not visit. This policy affects the following departments only:  [facility] Postpartum area Mother Baby Unit Newborn nursery/Special care nursery  Other areas: Masks continue to be strongly recommended for patient-facing teammates, visitors and patients in all other areas. Visitation is not restricted outside of the units listed above.

## 2022-05-14 ENCOUNTER — Encounter
Admission: RE | Admit: 2022-05-14 | Discharge: 2022-05-14 | Disposition: A | Discharge status: Home / Self Care | Payer: Medicare HMO | Source: Ambulatory Visit | Attending: Surgery | Admitting: Surgery

## 2022-05-14 DIAGNOSIS — Z01810 Encounter for preprocedural cardiovascular examination: Secondary | ICD-10-CM | POA: Insufficient documentation

## 2022-05-14 LAB — URINE CULTURE: Culture: NO GROWTH

## 2022-05-22 ENCOUNTER — Ambulatory Visit
Admission: RE | Admit: 2022-05-22 | Discharge: 2022-05-22 | Disposition: A | Discharge status: Home / Self Care | Payer: Medicare HMO | Source: Home / Self Care | Attending: Surgery | Admitting: Surgery

## 2022-05-22 ENCOUNTER — Other Ambulatory Visit: Payer: Self-pay

## 2022-05-22 ENCOUNTER — Emergency Department: Payer: Medicare HMO

## 2022-05-22 ENCOUNTER — Ambulatory Visit: Payer: Medicare HMO

## 2022-05-22 ENCOUNTER — Ambulatory Visit: Payer: Medicare HMO | Admitting: Urgent Care

## 2022-05-22 ENCOUNTER — Observation Stay
Admission: EM | Admit: 2022-05-22 | Discharge: 2022-05-23 | Disposition: A | Discharge status: Home / Self Care | Payer: Medicare HMO | Attending: Internal Medicine | Admitting: Internal Medicine

## 2022-05-22 ENCOUNTER — Encounter: Payer: Self-pay | Admitting: Surgery

## 2022-05-22 ENCOUNTER — Encounter
Admission: RE | Disposition: A | Discharge status: Home / Self Care | Payer: Self-pay | Source: Home / Self Care | Attending: Surgery

## 2022-05-22 DIAGNOSIS — Z9682 Presence of neurostimulator: Secondary | ICD-10-CM | POA: Insufficient documentation

## 2022-05-22 DIAGNOSIS — M67814 Other specified disorders of tendon, left shoulder: Secondary | ICD-10-CM | POA: Insufficient documentation

## 2022-05-22 DIAGNOSIS — I10 Essential (primary) hypertension: Secondary | ICD-10-CM | POA: Insufficient documentation

## 2022-05-22 DIAGNOSIS — M19012 Primary osteoarthritis, left shoulder: Secondary | ICD-10-CM | POA: Insufficient documentation

## 2022-05-22 DIAGNOSIS — Z96643 Presence of artificial hip joint, bilateral: Secondary | ICD-10-CM | POA: Insufficient documentation

## 2022-05-22 DIAGNOSIS — J189 Pneumonia, unspecified organism: Principal | ICD-10-CM | POA: Insufficient documentation

## 2022-05-22 DIAGNOSIS — Z87891 Personal history of nicotine dependence: Secondary | ICD-10-CM | POA: Insufficient documentation

## 2022-05-22 DIAGNOSIS — F329 Major depressive disorder, single episode, unspecified: Secondary | ICD-10-CM | POA: Insufficient documentation

## 2022-05-22 DIAGNOSIS — F419 Anxiety disorder, unspecified: Secondary | ICD-10-CM | POA: Insufficient documentation

## 2022-05-22 DIAGNOSIS — M75102 Unspecified rotator cuff tear or rupture of left shoulder, not specified as traumatic: Secondary | ICD-10-CM | POA: Insufficient documentation

## 2022-05-22 DIAGNOSIS — M6281 Muscle weakness (generalized): Secondary | ICD-10-CM | POA: Insufficient documentation

## 2022-05-22 DIAGNOSIS — Z20822 Contact with and (suspected) exposure to covid-19: Secondary | ICD-10-CM | POA: Diagnosis not present

## 2022-05-22 DIAGNOSIS — J449 Chronic obstructive pulmonary disease, unspecified: Secondary | ICD-10-CM | POA: Insufficient documentation

## 2022-05-22 DIAGNOSIS — A419 Sepsis, unspecified organism: Secondary | ICD-10-CM | POA: Diagnosis not present

## 2022-05-22 DIAGNOSIS — F32A Depression, unspecified: Secondary | ICD-10-CM | POA: Insufficient documentation

## 2022-05-22 DIAGNOSIS — Z79899 Other long term (current) drug therapy: Secondary | ICD-10-CM | POA: Diagnosis not present

## 2022-05-22 DIAGNOSIS — J690 Pneumonitis due to inhalation of food and vomit: Secondary | ICD-10-CM | POA: Diagnosis present

## 2022-05-22 DIAGNOSIS — N179 Acute kidney failure, unspecified: Secondary | ICD-10-CM | POA: Insufficient documentation

## 2022-05-22 DIAGNOSIS — R0602 Shortness of breath: Secondary | ICD-10-CM | POA: Diagnosis present

## 2022-05-22 DIAGNOSIS — J9601 Acute respiratory failure with hypoxia: Secondary | ICD-10-CM | POA: Diagnosis not present

## 2022-05-22 HISTORY — PX: REVERSE SHOULDER ARTHROPLASTY: SHX5054

## 2022-05-22 LAB — CBC WITH DIFFERENTIAL/PLATELET
Abs Immature Granulocytes: 0.05 10*3/uL (ref 0.00–0.07)
Basophils Absolute: 0 10*3/uL (ref 0.0–0.1)
Basophils Relative: 0 %
Eosinophils Absolute: 0 10*3/uL (ref 0.0–0.5)
Eosinophils Relative: 0 %
HCT: 36 % (ref 36.0–46.0)
Hemoglobin: 12.2 g/dL (ref 12.0–15.0)
Immature Granulocytes: 0 %
Lymphocytes Relative: 10 %
Lymphs Abs: 1.4 10*3/uL (ref 0.7–4.0)
MCH: 32.9 pg (ref 26.0–34.0)
MCHC: 33.9 g/dL (ref 30.0–36.0)
MCV: 97 fL (ref 80.0–100.0)
Monocytes Absolute: 0.7 10*3/uL (ref 0.1–1.0)
Monocytes Relative: 5 %
Neutro Abs: 11.8 10*3/uL — ABNORMAL HIGH (ref 1.7–7.7)
Neutrophils Relative %: 85 %
Platelets: 232 10*3/uL (ref 150–400)
RBC: 3.71 MIL/uL — ABNORMAL LOW (ref 3.87–5.11)
RDW: 13.2 % (ref 11.5–15.5)
WBC: 13.9 10*3/uL — ABNORMAL HIGH (ref 4.0–10.5)
nRBC: 0 % (ref 0.0–0.2)

## 2022-05-22 LAB — COMPREHENSIVE METABOLIC PANEL
ALT: 13 U/L (ref 0–44)
AST: 27 U/L (ref 15–41)
Albumin: 3.9 g/dL (ref 3.5–5.0)
Alkaline Phosphatase: 62 U/L (ref 38–126)
Anion gap: 9 (ref 5–15)
BUN: 33 mg/dL — ABNORMAL HIGH (ref 8–23)
CO2: 26 mmol/L (ref 22–32)
Calcium: 8.8 mg/dL — ABNORMAL LOW (ref 8.9–10.3)
Chloride: 105 mmol/L (ref 98–111)
Creatinine, Ser: 1.47 mg/dL — ABNORMAL HIGH (ref 0.44–1.00)
GFR, Estimated: 39 mL/min — ABNORMAL LOW (ref >60)
Glucose, Bld: 144 mg/dL — ABNORMAL HIGH (ref 70–99)
Potassium: 3.9 mmol/L (ref 3.5–5.1)
Sodium: 140 mmol/L (ref 135–145)
Total Bilirubin: 0.7 mg/dL (ref 0.3–1.2)
Total Protein: 7.1 g/dL (ref 6.5–8.1)

## 2022-05-22 LAB — RESP PANEL BY RT-PCR (RSV, FLU A&B, COVID)  RVPGX2
Influenza A by PCR: NEGATIVE
Influenza B by PCR: NEGATIVE
Resp Syncytial Virus by PCR: NEGATIVE
SARS Coronavirus 2 by RT PCR: NEGATIVE

## 2022-05-22 LAB — TROPONIN I (HIGH SENSITIVITY): Troponin I (High Sensitivity): 12 ng/L (ref <18)

## 2022-05-22 LAB — D-DIMER, QUANTITATIVE: D-Dimer, Quant: 1.91 ug/mL-FEU — ABNORMAL HIGH (ref 0.00–0.50)

## 2022-05-22 LAB — LACTIC ACID, PLASMA: Lactic Acid, Venous: 2.6 mmol/L (ref 0.5–1.9)

## 2022-05-22 SURGERY — ARTHROPLASTY, SHOULDER, TOTAL, REVERSE
Anesthesia: General | Site: Shoulder | Laterality: Left

## 2022-05-22 MED ORDER — OXYCODONE HCL 5 MG PO TABS: 5.0000 mg | ORAL_TABLET | ORAL | Status: DC | PRN

## 2022-05-22 MED ORDER — PHENYLEPHRINE HCL (PRESSORS) 10 MG/ML IV SOLN
INTRAVENOUS | Status: DC | PRN
  Administered 2022-05-22: 160 ug via INTRAVENOUS
  Administered 2022-05-22 (×2): 80 ug via INTRAVENOUS

## 2022-05-22 MED ORDER — ONDANSETRON HCL 4 MG/2ML IJ SOLN: 4.0000 mg | Freq: Four times a day (QID) | INTRAMUSCULAR | Status: DC | PRN

## 2022-05-22 MED ORDER — FENTANYL CITRATE PF 50 MCG/ML IJ SOSY
PREFILLED_SYRINGE | INTRAMUSCULAR | Status: AC
  Administered 2022-05-22: 50 ug via INTRAVENOUS
  Filled 2022-05-22: qty 1

## 2022-05-22 MED ORDER — SODIUM CHLORIDE 0.9 % IR SOLN
Status: DC | PRN
  Administered 2022-05-22: 3000 mL

## 2022-05-22 MED ORDER — LACTATED RINGERS IV SOLN: INTRAVENOUS | Status: DC

## 2022-05-22 MED ORDER — CEFAZOLIN SODIUM-DEXTROSE 2-4 GM/100ML-% IV SOLN
2.0000 g | INTRAVENOUS | Status: AC
  Administered 2022-05-22: 2 g via INTRAVENOUS

## 2022-05-22 MED ORDER — FENTANYL CITRATE PF 50 MCG/ML IJ SOSY
50.0000 ug | PREFILLED_SYRINGE | Freq: Once | INTRAMUSCULAR | Status: AC
  Administered 2022-05-22: 50 ug via INTRAVENOUS
  Filled 2022-05-22: qty 1

## 2022-05-22 MED ORDER — FAMOTIDINE 20 MG PO TABS
ORAL_TABLET | ORAL | Status: AC
  Administered 2022-05-22: 20 mg via ORAL
  Filled 2022-05-22: qty 1

## 2022-05-22 MED ORDER — PROPOFOL 10 MG/ML IV BOLUS
INTRAVENOUS | Status: AC
  Filled 2022-05-22: qty 20

## 2022-05-22 MED ORDER — IOHEXOL 350 MG/ML SOLN
75.0000 mL | Freq: Once | INTRAVENOUS | Status: AC | PRN
  Administered 2022-05-22: 75 mL via INTRAVENOUS

## 2022-05-22 MED ORDER — DOCUSATE SODIUM 100 MG PO CAPS: 100.0000 mg | ORAL_CAPSULE | Freq: Two times a day (BID) | ORAL | Status: DC | PRN

## 2022-05-22 MED ORDER — FENTANYL CITRATE PF 50 MCG/ML IJ SOSY: 50.0000 ug | PREFILLED_SYRINGE | Freq: Once | INTRAMUSCULAR | Status: AC

## 2022-05-22 MED ORDER — SODIUM CHLORIDE 0.9 % IV BOLUS (SEPSIS)
1000.0000 mL | Freq: Once | INTRAVENOUS | Status: AC
  Administered 2022-05-22: 1000 mL via INTRAVENOUS

## 2022-05-22 MED ORDER — DEXAMETHASONE SODIUM PHOSPHATE 10 MG/ML IJ SOLN
INTRAMUSCULAR | Status: AC
  Filled 2022-05-22: qty 1

## 2022-05-22 MED ORDER — ROCURONIUM BROMIDE 100 MG/10ML IV SOLN
INTRAVENOUS | Status: DC | PRN
  Administered 2022-05-22: 30 mg via INTRAVENOUS
  Administered 2022-05-22: 50 mg via INTRAVENOUS

## 2022-05-22 MED ORDER — OXYCODONE HCL 5 MG PO TABS: 5.0000 mg | ORAL_TABLET | ORAL | 0 refills | Status: DC | PRN

## 2022-05-22 MED ORDER — BUPIVACAINE LIPOSOME 1.3 % IJ SUSP
INTRAMUSCULAR | Status: DC | PRN
  Administered 2022-05-22: 20 mL

## 2022-05-22 MED ORDER — CHLORHEXIDINE GLUCONATE 0.12 % MT SOLN: 15.0000 mL | Freq: Once | OROMUCOSAL | Status: AC

## 2022-05-22 MED ORDER — OXYCODONE HCL 5 MG/5ML PO SOLN: 5.0000 mg | Freq: Once | ORAL | Status: DC | PRN

## 2022-05-22 MED ORDER — TRANEXAMIC ACID 1000 MG/10ML IV SOLN
INTRAVENOUS | Status: DC | PRN
  Administered 2022-05-22: 1000 mg via TOPICAL

## 2022-05-22 MED ORDER — FENTANYL CITRATE (PF) 100 MCG/2ML IJ SOLN
INTRAMUSCULAR | Status: AC
  Filled 2022-05-22: qty 2

## 2022-05-22 MED ORDER — KETOROLAC TROMETHAMINE 15 MG/ML IJ SOLN
INTRAMUSCULAR | Status: AC
  Filled 2022-05-22: qty 1

## 2022-05-22 MED ORDER — FAMOTIDINE 20 MG PO TABS: 20.0000 mg | ORAL_TABLET | Freq: Once | ORAL | Status: AC

## 2022-05-22 MED ORDER — LIDOCAINE HCL (PF) 2 % IJ SOLN
INTRAMUSCULAR | Status: AC
  Filled 2022-05-22: qty 5

## 2022-05-22 MED ORDER — BUPIVACAINE-EPINEPHRINE (PF) 0.5% -1:200000 IJ SOLN
INTRAMUSCULAR | Status: AC
  Filled 2022-05-22: qty 30

## 2022-05-22 MED ORDER — EPHEDRINE SULFATE (PRESSORS) 50 MG/ML IJ SOLN
INTRAMUSCULAR | Status: DC | PRN
  Administered 2022-05-22: 5 mg via INTRAVENOUS

## 2022-05-22 MED ORDER — METOCLOPRAMIDE HCL 5 MG/ML IJ SOLN: 5.0000 mg | Freq: Three times a day (TID) | INTRAMUSCULAR | Status: DC | PRN

## 2022-05-22 MED ORDER — BUPIVACAINE-EPINEPHRINE (PF) 0.5% -1:200000 IJ SOLN
INTRAMUSCULAR | Status: DC | PRN
  Administered 2022-05-22: 30 mL

## 2022-05-22 MED ORDER — ORAL CARE MOUTH RINSE: 15.0000 mL | Freq: Once | OROMUCOSAL | Status: AC

## 2022-05-22 MED ORDER — MIDAZOLAM HCL 2 MG/2ML IJ SOLN: 1.0000 mg | Freq: Once | INTRAMUSCULAR | Status: AC

## 2022-05-22 MED ORDER — DEXMEDETOMIDINE HCL IN NACL 80 MCG/20ML IV SOLN
INTRAVENOUS | Status: DC | PRN
  Administered 2022-05-22: 20 ug via BUCCAL

## 2022-05-22 MED ORDER — SODIUM CHLORIDE 0.9 % IV SOLN: INTRAVENOUS | Status: DC

## 2022-05-22 MED ORDER — ONDANSETRON HCL 4 MG PO TABS: 4.0000 mg | ORAL_TABLET | Freq: Four times a day (QID) | ORAL | Status: DC | PRN

## 2022-05-22 MED ORDER — FENTANYL CITRATE (PF) 100 MCG/2ML IJ SOLN
INTRAMUSCULAR | Status: DC | PRN
  Administered 2022-05-22 (×4): 50 ug via INTRAVENOUS

## 2022-05-22 MED ORDER — PROPOFOL 10 MG/ML IV BOLUS
INTRAVENOUS | Status: DC | PRN
  Administered 2022-05-22: 40 mg via INTRAVENOUS
  Administered 2022-05-22: 140 mg via INTRAVENOUS

## 2022-05-22 MED ORDER — BUPIVACAINE HCL (PF) 0.5 % IJ SOLN
INTRAMUSCULAR | Status: AC
  Filled 2022-05-22: qty 10

## 2022-05-22 MED ORDER — FENTANYL CITRATE (PF) 100 MCG/2ML IJ SOLN: 25.0000 ug | INTRAMUSCULAR | Status: DC | PRN

## 2022-05-22 MED ORDER — MIDAZOLAM HCL 2 MG/2ML IJ SOLN
INTRAMUSCULAR | Status: AC
  Administered 2022-05-22: 1 mg via INTRAVENOUS
  Filled 2022-05-22: qty 2

## 2022-05-22 MED ORDER — ONDANSETRON HCL 4 MG/2ML IJ SOLN
INTRAMUSCULAR | Status: AC
  Filled 2022-05-22: qty 2

## 2022-05-22 MED ORDER — 0.9 % SODIUM CHLORIDE (POUR BTL) OPTIME
TOPICAL | Status: DC | PRN
  Administered 2022-05-22: 500 mL

## 2022-05-22 MED ORDER — CEFAZOLIN SODIUM-DEXTROSE 2-4 GM/100ML-% IV SOLN
INTRAVENOUS | Status: AC
  Administered 2022-05-22: 2 g via INTRAVENOUS
  Filled 2022-05-22: qty 100

## 2022-05-22 MED ORDER — KETOROLAC TROMETHAMINE 15 MG/ML IJ SOLN
15.0000 mg | Freq: Once | INTRAMUSCULAR | Status: AC
  Administered 2022-05-22: 15 mg via INTRAVENOUS

## 2022-05-22 MED ORDER — LIDOCAINE HCL (CARDIAC) PF 100 MG/5ML IV SOSY
PREFILLED_SYRINGE | INTRAVENOUS | Status: DC | PRN
  Administered 2022-05-22: 80 mg via INTRAVENOUS

## 2022-05-22 MED ORDER — CEFAZOLIN SODIUM-DEXTROSE 2-4 GM/100ML-% IV SOLN: 2.0000 g | Freq: Four times a day (QID) | INTRAVENOUS | Status: DC

## 2022-05-22 MED ORDER — ONDANSETRON HCL 4 MG/2ML IJ SOLN
4.0000 mg | Freq: Once | INTRAMUSCULAR | Status: AC
  Administered 2022-05-22: 4 mg via INTRAVENOUS
  Filled 2022-05-22: qty 2

## 2022-05-22 MED ORDER — DEXAMETHASONE SODIUM PHOSPHATE 10 MG/ML IJ SOLN
INTRAMUSCULAR | Status: DC | PRN
  Administered 2022-05-22: 5 mg via INTRAVENOUS

## 2022-05-22 MED ORDER — METOCLOPRAMIDE HCL 10 MG PO TABS: 5.0000 mg | ORAL_TABLET | Freq: Three times a day (TID) | ORAL | Status: DC | PRN

## 2022-05-22 MED ORDER — BUPIVACAINE HCL (PF) 0.5 % IJ SOLN
INTRAMUSCULAR | Status: DC | PRN
  Administered 2022-05-22: 10 mL

## 2022-05-22 MED ORDER — OXYCODONE HCL 5 MG PO TABS: 5.0000 mg | ORAL_TABLET | Freq: Once | ORAL | Status: DC | PRN

## 2022-05-22 MED ORDER — ROCURONIUM BROMIDE 10 MG/ML (PF) SYRINGE
PREFILLED_SYRINGE | INTRAVENOUS | Status: AC
  Filled 2022-05-22: qty 10

## 2022-05-22 MED ORDER — CEFAZOLIN SODIUM-DEXTROSE 2-4 GM/100ML-% IV SOLN
INTRAVENOUS | Status: AC
  Filled 2022-05-22: qty 100

## 2022-05-22 MED ORDER — BUPIVACAINE LIPOSOME 1.3 % IJ SUSP
INTRAMUSCULAR | Status: AC
  Filled 2022-05-22: qty 20

## 2022-05-22 MED ORDER — CHLORHEXIDINE GLUCONATE 0.12 % MT SOLN
OROMUCOSAL | Status: AC
  Administered 2022-05-22: 15 mL via OROMUCOSAL
  Filled 2022-05-22: qty 15

## 2022-05-22 SURGICAL SUPPLY — 69 items
APL PRP STRL LF DISP 70% ISPRP (MISCELLANEOUS) ×1
BASEPLATE AUG FULL 24 20D (Plate) IMPLANT
BIT DRILL FLUTED 3.0 STRL (BIT) IMPLANT
BLADE SAW SAG 25X90X1.19 (BLADE) ×1 IMPLANT
BSPLAT GLND 20D OBLQ 24 FULL (Plate) ×1 IMPLANT
CHLORAPREP W/TINT 26 (MISCELLANEOUS) ×1 IMPLANT
COOLER POLAR GLACIER W/PUMP (MISCELLANEOUS) ×1 IMPLANT
COVER BACK TABLE REUSABLE LG (DRAPES) ×1 IMPLANT
CUP SUT UNIV REVERS 36 NEUTRAL (Cup) IMPLANT
DRAPE 3/4 80X56 (DRAPES) ×1 IMPLANT
DRAPE INCISE IOBAN 66X45 STRL (DRAPES) ×1 IMPLANT
DRSG OPSITE POSTOP 4X8 (GAUZE/BANDAGES/DRESSINGS) ×1 IMPLANT
ELECT BLADE 6.5 EXT (BLADE) IMPLANT
ELECT CAUTERY BLADE 6.4 (BLADE) ×1 IMPLANT
ELECT REM PT RETURN 9FT ADLT (ELECTROSURGICAL) ×1
ELECTRODE REM PT RTRN 9FT ADLT (ELECTROSURGICAL) ×1 IMPLANT
GAUZE XEROFORM 1X8 LF (GAUZE/BANDAGES/DRESSINGS) ×1 IMPLANT
GLENOSPHERE 36 +4 LAT/24 (Joint) IMPLANT
GLOVE BIO SURGEON STRL SZ7.5 (GLOVE) ×4 IMPLANT
GLOVE BIO SURGEON STRL SZ8 (GLOVE) ×4 IMPLANT
GLOVE BIOGEL PI IND STRL 8 (GLOVE) ×2 IMPLANT
GLOVE SURG UNDER LTX SZ8 (GLOVE) ×1 IMPLANT
GOWN STRL REUS W/ TWL LRG LVL3 (GOWN DISPOSABLE) ×1 IMPLANT
GOWN STRL REUS W/ TWL XL LVL3 (GOWN DISPOSABLE) ×1 IMPLANT
GOWN STRL REUS W/TWL LRG LVL3 (GOWN DISPOSABLE) ×1
GOWN STRL REUS W/TWL XL LVL3 (GOWN DISPOSABLE) ×1
HOOD PEEL AWAY T7 (MISCELLANEOUS) ×3 IMPLANT
IV NS IRRIG 3000ML ARTHROMATIC (IV SOLUTION) ×1 IMPLANT
KIT STABILIZATION SHOULDER (MISCELLANEOUS) ×1 IMPLANT
KIT TURNOVER KIT A (KITS) ×1 IMPLANT
LINER HUMERAL 36 +3MM SM (Shoulder) IMPLANT
MANIFOLD NEPTUNE II (INSTRUMENTS) ×1 IMPLANT
MASK FACE SPIDER DISP (MASK) ×1 IMPLANT
MAT ABSORB  FLUID 56X50 GRAY (MISCELLANEOUS) ×1
MAT ABSORB FLUID 56X50 GRAY (MISCELLANEOUS) ×1 IMPLANT
NDL MAYO CATGUT SZ1 (NEEDLE) IMPLANT
NDL SAFETY ECLIP 18X1.5 (MISCELLANEOUS) ×1 IMPLANT
NDL SPNL 20GX3.5 QUINCKE YW (NEEDLE) ×1 IMPLANT
NEEDLE MAYO CATGUT SZ1 (NEEDLE) IMPLANT
NEEDLE SPNL 20GX3.5 QUINCKE YW (NEEDLE) ×1 IMPLANT
NS IRRIG 500ML POUR BTL (IV SOLUTION) ×1 IMPLANT
PACK ARTHROSCOPY SHOULDER (MISCELLANEOUS) ×1 IMPLANT
PAD ARMBOARD 7.5X6 YLW CONV (MISCELLANEOUS) ×1 IMPLANT
PAD WRAPON POLAR SHDR UNIV (MISCELLANEOUS) ×1 IMPLANT
PIN SET MODULAR GLENOID SYSTEM (PIN) IMPLANT
POST MODULAR 25 (Post) ×1 IMPLANT
POST MODULAR MGS BASEPLATE 25 (Post) IMPLANT
PULSAVAC PLUS IRRIG FAN TIP (DISPOSABLE) ×1
REAMER ANGLED HEAD SMALL (DRILL) IMPLANT
SCREW PERI LOCK 5.5X16 (Screw) IMPLANT
SCREW PERI LOCK 5.5X32 (Screw) IMPLANT
SCREW PERIPHERAL NL 4.5X36 (Screw) IMPLANT
SLING ULTRA II LG (MISCELLANEOUS) ×1 IMPLANT
SPONGE T-LAP 18X18 ~~LOC~~+RFID (SPONGE) ×2 IMPLANT
STAPLER SKIN PROX 35W (STAPLE) ×1 IMPLANT
STEM HUMERAL UNIVER REV SIZE 7 (Stem) IMPLANT
SUT ETHIBOND 0 MO6 C/R (SUTURE) ×1 IMPLANT
SUT FIBERWIRE #2 38 BLUE 1/2 (SUTURE) ×3
SUT VIC AB 0 CT1 36 (SUTURE) ×1 IMPLANT
SUT VIC AB 2-0 CT1 27 (SUTURE) ×2
SUT VIC AB 2-0 CT1 TAPERPNT 27 (SUTURE) ×2 IMPLANT
SUTURE FIBERWR #2 38 BLUE 1/2 (SUTURE) ×4 IMPLANT
SYR 10ML LL (SYRINGE) ×1 IMPLANT
SYR 30ML LL (SYRINGE) ×1 IMPLANT
SYR TOOMEY 50ML (SYRINGE) ×1 IMPLANT
TIP FAN IRRIG PULSAVAC PLUS (DISPOSABLE) ×1 IMPLANT
TRAP FLUID SMOKE EVACUATOR (MISCELLANEOUS) ×1 IMPLANT
WATER STERILE IRR 500ML POUR (IV SOLUTION) ×1 IMPLANT
WRAPON POLAR PAD SHDR UNIV (MISCELLANEOUS) ×1

## 2022-05-22 NOTE — ED Provider Notes (Incomplete)
[facility] Provider Note    Event Date/Time   First MD Initiated Contact with Patient 05/22/22 2301     (approximate)   History   Shortness of Breath   HPI  Emma Spears is a 66 y.o. female with history of COPD, hypertension, obesity who presents to the emergency department with complaints of left-sided sharp chest pain and shortness of breath.  Was just discharged from the hospital today after a reverse right total shoulder arthroplasty by Dr. Joice Lofts.  It appears she was intubated.  Was extubated prior to going to the PACU but states since waking up she has had a nonproductive cough and felt "rattling" in her chest.  Symptoms worsened tonight and she called 911.  She received 2 breathing treatments with EMS and states she has had no improvement.  No fevers.  No wheezing.   History provided by patient and EMS.    Past Medical History:  Diagnosis Date  . Anxiety   . Arthritis   . Depression   . Hypertension     Past Surgical History:  Procedure Laterality Date  . ABDOMINAL HYSTERECTOMY N/A 1981  . BACK SURGERY N/A  2 in 2001, 2003,  2 in 2005   herniated discs, implant repair work  . CHOLECYSTECTOMY    . COLONOSCOPY W/ POLYPECTOMY    . GALLBLADDER SURGERY N/A 09/1975  . JOINT REPLACEMENT Left 2018   total hip  . SPINAL CORD STIMULATOR IMPLANT Right 2003  . TOTAL HIP ARTHROPLASTY Left 05/28/2017   Procedure: TOTAL HIP ARTHROPLASTY ANTERIOR APPROACH;  Surgeon: Kennedy Bucker, MD;  Location: [facility] ORS;  Service: Orthopedics;  Laterality: Left;  . TOTAL HIP ARTHROPLASTY Right 09/03/2017   Procedure: TOTAL HIP ARTHROPLASTY ANTERIOR APPROACH;  Surgeon: Kennedy Bucker, MD;  Location: [facility] ORS;  Service: Orthopedics;  Laterality: Right;    MEDICATIONS:  Prior to Admission medications   Medication Sig Start Date End Date Taking? Authorizing Provider  escitalopram (LEXAPRO) 20 MG tablet Take 20 mg by mouth daily.   Yes [provider]   gabapentin (NEURONTIN) 300 MG capsule Take 300 mg by mouth at bedtime.  900mg  bedtime   Yes [provider]  hydrochlorothiazide (HYDRODIURIL) 25 MG tablet Take 25 mg by mouth daily. 03/27/22  Yes [provider]  hydrOXYzine (ATARAX) 25 MG tablet Take 25 mg by mouth daily as needed for anxiety. 03/04/22  Yes [provider]  meloxicam (MOBIC) 7.5 MG tablet Take 7.5 mg by mouth daily.   Yes [provider]  Multiple Vitamin (MULTIVITAMIN WITH MINERALS) TABS tablet Take 1 tablet by mouth daily.   Yes [provider]  traZODone (DESYREL) 50 MG tablet Take 50 mg by mouth at bedtime.   Yes [provider]  acetaminophen (TYLENOL) 325 MG tablet Take 1-2 tablets (325-650 mg total) by mouth every 6 (six) hours as needed for mild pain (pain score 1-3 or temp > 100.5). 09/04/17   09/06/17, PA-C  docusate sodium (COLACE) 100 MG capsule Take 1 capsule (100 mg total) by mouth 2 (two) times daily as needed for mild constipation. 05/22/22   Poggi, 05/24/22, MD  loratadine (CLARITIN) 10 MG tablet Take 10 mg by mouth daily as needed for allergies.    [provider]  oxyCODONE (ROXICODONE) 5 MG immediate release tablet Take 1-2 tablets (5-10 mg total) by mouth every 4 (four) hours as needed for moderate pain or severe pain. 05/22/22   Poggi, 05/24/22, MD  Physical Exam   Triage Vital Signs: ED Triage Vitals  Enc Vitals Group     BP 05/22/22 2258 106/67     Pulse Rate 05/22/22 2248 90     Resp 05/22/22 2248 20     Temp 05/22/22 2258 98.2 F (36.8 C)     Temp Source 05/22/22 2258 Oral     SpO2 05/22/22 2248 98 %     Weight 05/22/22 2249 215 lb (97.5 kg)     Height 05/22/22 2249 5\' 3"  (1.6 m)     Head Circumference --      Peak Flow --      Pain Score --      Pain Loc --      Pain Edu? --      Excl. in GC? --     Most recent vital signs: Vitals:   05/22/22 2248 05/22/22 2258  BP:  106/67  Pulse: 90   Resp: 20   Temp:  98.2 F  (36.8 C)  SpO2: 98% 94%    CONSTITUTIONAL: Alert and oriented and responds appropriately to questions.  Obese, appears older than stated age HEAD: Normocephalic, atraumatic EYES: Conjunctivae clear, pupils appear equal, sclera nonicteric ENT: normal nose; moist mucous membranes NECK: Supple, normal ROM CARD: RRR; S1 and S2 appreciated; no murmurs, no clicks, no rubs, no gallops RESP: Normal chest excursion without splinting or tachypnea; breath sounds clear and equal bilaterally; no wheezes, no rhonchi, no rales, no hypoxia or respiratory distress, speaking full sentences, diminished breath sounds diffusely ABD/GI: Normal bowel sounds; non-distended; soft, non-tender, no rebound, no guarding, no peritoneal signs BACK: The back appears normal EXT: Normal ROM in all joints; no deformity noted, no edema; no cyanosis SKIN: Normal color for age and race; warm; no rash on exposed skin NEURO: Moves all extremities equally, normal speech PSYCH: The patient's mood and manner are appropriate.   ED Results / Procedures / Treatments   LABS: (all labs ordered are listed, but only abnormal results are displayed) Labs Reviewed  CBC WITH DIFFERENTIAL/PLATELET - Abnormal; Notable for the following components:      Result Value   WBC 13.9 (*)    RBC 3.71 (*)    Neutro Abs 11.8 (*)    All other components within normal limits  COMPREHENSIVE METABOLIC PANEL - Abnormal; Notable for the following components:   Glucose, Bld 144 (*)    BUN 33 (*)    Creatinine, Ser 1.47 (*)    Calcium 8.8 (*)    GFR, Estimated 39 (*)    All other components within normal limits  D-DIMER, QUANTITATIVE - Abnormal; Notable for the following components:   D-Dimer, Quant 1.91 (*)    All other components within normal limits  LACTIC ACID, PLASMA - Abnormal; Notable for the following components:   Lactic Acid, Venous 2.6 (*)    All other components within normal limits  RESP PANEL BY RT-PCR (RSV, FLU A&B, COVID)  [id number]   LACTIC ACID, PLASMA  TROPONIN I (HIGH SENSITIVITY)     EKG:  EKG Interpretation  Date/Time:  Thursday May 22 2022 23:08:30 EST Ventricular Rate:  87 PR Interval:  156 QRS Duration: 139 QT Interval:  436 QTC Calculation: 525 R Axis:   -58 Text Interpretation: Sinus rhythm RBBB and LAFB No significant change since last tracing Confirmed by 08-05-1974 [phone]) on 05/22/2022 11:12:42 PM         RADIOLOGY: My personal review and interpretation of imaging:  ***  I have  personally reviewed all radiology reports.   DG Chest Portable 1 View  Result Date: 05/22/2022 CLINICAL DATA:  Shortness of breath. Left shoulder replacement today. EXAM: PORTABLE CHEST 1 VIEW COMPARISON:  11/02/2007 FINDINGS: Shallow inspiration with atelectasis in the left lung base. Heart size and pulmonary vascularity are normal for technique. No pleural effusion or pneumothorax. Calcified and tortuous aorta. Postoperative changes with reverse left shoulder arthroplasty. Stimulator lead tips projected over the midthoracic spine. IMPRESSION: Shallow inspiration with atelectasis in the left base. Electronically Signed   By: Burman Nieves M.D.   On: 05/22/2022 23:37   DG Shoulder Left Port  Result Date: 05/22/2022 CLINICAL DATA:  Status post reversed left shoulder arthroplasty. EXAM: LEFT SHOULDER COMPARISON:  CT left shoulder 03/21/2022 FINDINGS: Interval reverse left shoulder arthroplasty. No perihardware lucency is seen to indicate hardware failure or loosening. Normal alignment. Expected postoperative air within the superior shoulder. Anterior surgical skin staples. Normal alignment of the acromioclavicular joint. No acute fracture or dislocation. Partial visualization of lower thoracic spine spinal cord stimulator. IMPRESSION: Interval reverse left shoulder arthroplasty without evidence of hardware failure. Electronically Signed   By: Neita Garnet M.D.   On: 05/22/2022 14:33   [place] OR NERVE BLOCK-IMAGE  ONLY [facility])  Result Date: 05/22/2022 There is no interpretation for this exam.  This order is for images obtained during a surgical procedure.  Please See "Surgeries" Tab for more information regarding the procedure.     PROCEDURES:  Critical Care performed: {CriticalCareYesNo:19197::"Yes, see critical care procedure note(s)","No"}   CRITICAL CARE Performed by: Rochele Raring   Total critical care time: *** minutes  Critical care time was exclusive of separately billable procedures and treating other patients.  Critical care was necessary to treat or prevent imminent or life-threatening deterioration.  Critical care was time spent personally by me on the following activities: development of treatment plan with patient and/or surrogate as well as nursing, discussions with consultants, evaluation of patient's response to treatment, examination of patient, obtaining history from patient or surrogate, ordering and performing treatments and interventions, ordering and review of laboratory studies, ordering and review of radiographic studies, pulse oximetry and re-evaluation of patient's condition.   Marland Kitchen1-3 Lead EKG Interpretation  Performed by: Kanija Remmel, Layla Maw, DO Authorized by: Erich Kochan, Layla Maw, DO     Interpretation: normal     ECG rate:  90   ECG rate assessment: normal     Rhythm: sinus rhythm     Ectopy: none     Conduction: normal       IMPRESSION / MDM / ASSESSMENT AND PLAN / ED COURSE  I reviewed the triage vital signs and the nursing notes.    ***  The patient is on the cardiac monitor to evaluate for evidence of arrhythmia and/or significant heart rate changes.   DIFFERENTIAL DIAGNOSIS (includes but not limited to):   ***   Patient's presentation is most consistent with {EM COPA:27473}   PLAN: ***   MEDICATIONS GIVEN IN ED: Medications  sodium chloride 0.9 % bolus 1,000 mL (1,000 mLs Intravenous New Bag/Given 05/22/22 2338)  iohexol (OMNIPAQUE) 350 MG/ML  injection 75 mL (has no administration in time range)  fentaNYL (SUBLIMAZE) injection 50 mcg (50 mcg Intravenous Given 05/22/22 2338)  ondansetron (ZOFRAN) injection 4 mg (4 mg Intravenous Given 05/22/22 2338)     ED COURSE:  ***   CONSULTS:  ***   OUTSIDE RECORDS REVIEWED:  ***       FINAL CLINICAL IMPRESSION(S) / ED DIAGNOSES  Final diagnoses:  None     Rx / DC Orders   ED Discharge Orders     None        Note:  This document was prepared using Dragon voice recognition software and may include unintentional dictation errors.

## 2022-05-22 NOTE — Evaluation (Signed)
Occupational Therapy Evaluation Patient Details Name: Emma Spears MRN: [id number] DOB: 04-15-1956 Today's Date: 05/22/2022   History of Present Illness Emma Spears is a 66 y.o. female who presents with L shoulder pain 2/2 advanced degenerative joint disease. Pt is now s/p L Rev TSA. PMH includes: HTN, chicken pox.   Clinical Impression   Emma Spears was seen for an OT evaluation this date. Pt lives with her daughter in a 1 level home with 3 STE. Prior to surgery, pt required some assist with ADL management including bathing and overhead dressing 2/2 increased pain with shoulder mobility. Pt has orders for LUE to be immobilized and will be NWBing per MD. Patient presents with impaired strength/ROM, pain, and sensation to LUE with block not completely resolved. These impairments result in a decreased ability to perform self care tasks requiring MOD assist for UB/LB dressing and bathing and MAX assist for application of polar care, and sling/immobilizer. Pt/caregiver instructed in polar care mgt, sling/immobilizer mgt, ROM exercises for LUE (with instructions for no shoulder exercises until full sensation has returned), LUE precautions, adaptive strategies for bathing/dressing/toileting/grooming, positioning and considerations for sleep, and home/routines modifications to maximize falls prevention, safety, and independence. OT adjusted sling/immobilizer and polar care to improve comfort, optimize positioning, and to maximize skin integrity/safety. Pt/caregiver return demo understanding of education provided. No further acute OT needs identified. Recommend pt follow surgeons recommendations for follow up therapy upon acute hospital DC. Will sign off. Please re-consult if additional OT needs arise during this hospital stay.        Recommendations for follow up therapy are one component of a multi-disciplinary discharge planning process, led by the attending physician.  Recommendations may be  updated based on patient status, additional functional criteria and insurance authorization.   Follow Up Recommendations  Follow physician's recommendations for discharge plan and follow up therapies     Assistance Recommended at Discharge Intermittent Supervision/Assistance  Patient can return home with the following Assistance with feeding;A lot of help with bathing/dressing/bathroom;Assistance with cooking/housework;Assist for transportation    Functional Status Assessment  Patient has had a recent decline in their functional status and demonstrates the ability to make significant improvements in function in a reasonable and predictable amount of time.  Equipment Recommendations  None recommended by OT    Recommendations for Other Services       Precautions / Restrictions Precautions Precautions: Fall;Shoulder Shoulder Interventions: Shoulder sling/immobilizer;Shoulder abduction pillow;At all times;Off for dressing/bathing/exercises Restrictions Weight Bearing Restrictions: Yes LUE Weight Bearing: Non weight bearing      Mobility Bed Mobility               General bed mobility comments: Deferred. Pt in recliner at start/end of session.    Transfers Overall transfer level: Needs assistance Equipment used: None, 1 person hand held assist Transfers: Sit to/from Stand Sit to Stand: Supervision                  Balance Overall balance assessment: No apparent balance deficits (not formally assessed)                                         ADL either performed or assessed with clinical judgement   ADL Overall ADL's : Needs assistance/impaired  Functional mobility during ADLs: Supervision/safety General ADL Comments: Functionally limited by decreased functional use of her LUE and increased pain with activity in her RUE. Requires MAX A for sling/polar care management and UB dressing. Educated  on strategies for ADL management in setting of shoulder precations.     Vision Baseline Vision/History: 1 Wears glasses Patient Visual Report: No change from baseline       Perception     Praxis      Pertinent Vitals/Pain Pain Assessment Pain Assessment: No/denies pain (nerve block still in place.)     Hand Dominance Right   Extremity/Trunk Assessment Upper Extremity Assessment Upper Extremity Assessment: LUE deficits/detail;RUE deficits/detail RUE Deficits / Details: Hx of R artritis, increased pain with shoulder ROM/functional activity. LUE Deficits / Details: s/p L RTSA with orders for LUE to remain immobilized and NWB. Pt endorses decreased sensation with nerve block not yet resolved. LUE Sensation: decreased light touch;decreased proprioception LUE Coordination: decreased fine motor;decreased gross motor   Lower Extremity Assessment Lower Extremity Assessment: Overall WFL for tasks assessed       Communication Communication Communication: No difficulties   Cognition Arousal/Alertness: Awake/alert Behavior During Therapy: WFL for tasks assessed/performed Overall Cognitive Status: Within Functional Limits for tasks assessed                                 General Comments: Pleasant, conversational, mildly lethargic suspect 2/2 medications.     General Comments  LUE surgical dressing CDI at start/end of session.    Exercises Other Exercises Other Exercises: Pt/caregiver educated on role of OT in acute setting, sling/polar care management, shoulder precautions, falls prevention strategies and routines modifications to support safety and functional independence during ADL management.   Shoulder Instructions Shoulder Instructions Donning/doffing shirt without moving shoulder: Maximal assistance;Caregiver independent with task Method for sponge bathing under operated UE: Minimal assistance;Caregiver independent with task Donning/doffing  sling/immobilizer: Maximal assistance;Caregiver independent with task Correct positioning of sling/immobilizer: Maximal assistance ROM for elbow, wrist and digits of operated UE: Supervision/safety;Caregiver independent with task Sling wearing schedule (on at all times/off for ADL's): Caregiver independent with task;Supervision/safety Positioning of UE while sleeping: Caregiver independent with task;Supervision/safety    Home Living Family/patient expects to be discharged to:: Private residence Living Arrangements: Children Available Help at Discharge: Family;Available 24 hours/day Type of Home: House Home Access: Stairs to enter [employer] of Steps: 3 STE, R rail Entrance Stairs-Rails: Right Home Layout: One level     Bathroom Shower/Tub: Chief Strategy Officer: Handicapped height     Home Equipment: Grab bars - tub/shower;Rolling Environmental consultant (2 wheels);Rollator (4 wheels)          Prior Functioning/Environment Prior Level of Function : Needs assist;Independent/Modified Independent             Mobility Comments: Uses rollator for longer community distances, denies falls hx. ADLs Comments: Required some assist with ADL management 2/2 bilateral shoulder pain. Family assists with driving.        OT Problem List: Decreased strength;Decreased range of motion;Decreased knowledge of use of DME or AE;Impaired UE functional use;Decreased knowledge of precautions      OT Treatment/Interventions:      OT Goals(Current goals can be found in the care plan section) Acute Rehab OT Goals Patient Stated Goal: To go home today OT Goal Formulation: All assessment and education complete, DC therapy Time For Goal Achievement: 05/22/22 Potential to Achieve Goals: Good  OT  Frequency:      Co-evaluation              AM-PAC OT "6 Clicks" Daily Activity     Outcome Measure Help from another person eating meals?: A Little Help from another person taking care of  personal grooming?: A Little Help from another person toileting, which includes using toliet, bedpan, or urinal?: A Little Help from another person bathing (including washing, rinsing, drying)?: A Little Help from another person to put on and taking off regular upper body clothing?: A Little Help from another person to put on and taking off regular lower body clothing?: A Little 6 Click Score: 18   End of Session Nurse Communication: Mobility status;Other (comment) (Pt coughing with spO2 of 91-92% during session.)  Activity Tolerance: Patient tolerated treatment well Patient left: in chair;with call bell/phone within reach;with family/visitor present;with nursing/sitter in room (In PACU)  OT Visit Diagnosis: Other abnormalities of gait and mobility (R26.89);Pain Pain - Right/Left: Right Pain - part of body: Shoulder                Time: 3220-2542 OT Time Calculation (min): 41 min Charges:  OT General Charges $OT Visit: 1 Visit OT Evaluation $OT Eval Low Complexity: 1 Low OT Treatments $Self Care/Home Management : 23-37 mins  Rockney Ghee, M.S., OTR/L 05/22/22, 4:00 PM

## 2022-05-22 NOTE — Anesthesia Postprocedure Evaluation (Signed)
Anesthesia Post Note  Patient: Chana Bode  Procedure(s) Performed: REVERSE SHOULDER ARTHROPLASTY WITH BICEPS TENODESIS (Left: Shoulder)  Patient location during evaluation: PACU Anesthesia Type: General Level of consciousness: awake and alert Pain management: pain level controlled Vital Signs Assessment: post-procedure vital signs reviewed and stable Respiratory status: spontaneous breathing, nonlabored ventilation, respiratory function stable and patient connected to nasal cannula oxygen Cardiovascular status: blood pressure returned to baseline and stable Postop Assessment: no apparent nausea or vomiting Anesthetic complications: no  No notable events documented.   Last Vitals:  Vitals:   05/22/22 1420 05/22/22 1430  BP: 102/72 135/85  Pulse: 83 97  Resp: 19 20  Temp:  37.4 C  SpO2: 93% 95%    Last Pain:  Vitals:   05/22/22 1430  TempSrc: Temporal  PainSc: 0-No pain                 Stephanie Coup

## 2022-05-22 NOTE — ED Notes (Signed)
Pt to CT via stretcher at this time

## 2022-05-22 NOTE — Anesthesia Procedure Notes (Addendum)
Anesthesia Regional Block: Interscalene brachial plexus block   Pre-Anesthetic Checklist: , timeout performed,  Correct Patient, Correct Site, Correct Laterality,  Correct Procedure, Correct Position, site marked,  Risks and benefits discussed,  Surgical consent,  Pre-op evaluation,  At surgeon's request and post-op pain management  Laterality: Left  Prep: chloraprep       Needles:  Injection technique: Single-shot  Needle Type: Echogenic Needle     Needle Length: 4cm  Needle Gauge: 25     Additional Needles:   Procedures:,,,, ultrasound used (permanent image in chart),,    Narrative:  Start time: 05/22/2022 10:25 AM End time: 05/22/2022 10:30 AM Injection made incrementally with aspirations every 5 mL.  Performed by: Personally  Anesthesiologist: Stephanie Coup, MD  Additional Notes: Patient's chart reviewed and they were deemed appropriate candidate for procedure, at surgeon's request. Patient educated about risks, benefits, and alternatives of the block including but not limited to: temporary or permanent nerve damage, bleeding, infection, damage to surround tissues, pneumothorax, hemidiaphragmatic paralysis, unilateral Horner's syndrome, block failure, local anesthetic toxicity. Patient expressed understanding. A formal time-out was conducted consistent with institution rules.  Monitors were applied, and minimal sedation used (see nursing record). The site was prepped with skin prep and allowed to dry, and sterile gloves were used. A high frequency linear ultrasound probe with probe cover was utilized throughout. C5-7 nerve roots located and appeared anatomically normal, local anesthetic injected around them, and echogenic block needle trajectory was monitored throughout. Aspiration performed every 22ml. Lung and blood vessels were avoided. All injections were performed without resistance and free of blood and paresthesias. The patient tolerated the procedure well.  Injectate:  68ml exparel + 31ml 0.5% bupivacaine

## 2022-05-22 NOTE — Anesthesia Procedure Notes (Signed)
Procedure Name: Intubation Date/Time: 05/22/2022 10:53 AM  Performed by: Berniece Pap, CRNAPre-anesthesia Checklist: Patient identified, Emergency Drugs available, Suction available and Patient being monitored Patient Re-evaluated:Patient Re-evaluated prior to induction Oxygen Delivery Method: Circle system utilized Preoxygenation: Pre-oxygenation with 100% oxygen Induction Type: IV induction Ventilation: Mask ventilation without difficulty Laryngoscope Size: McGraph and 4 Grade View: Grade I Tube type: Oral Tube size: 7.0 mm Number of attempts: 1 Airway Equipment and Method: Stylet and Oral airway Placement Confirmation: ETT inserted through vocal cords under direct vision, positive ETCO2 and breath sounds checked- equal and bilateral Secured at: 20 cm Tube secured with: Tape Dental Injury: Teeth and Oropharynx as per pre-operative assessment

## 2022-05-22 NOTE — Op Note (Signed)
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  Post-Op Diagnosis:   Same  Procedure:   Reverse left total shoulder arthroplasty with biceps tenodesis.  Surgeon:   Maryagnes Amos, MD  Assistant:   Horris Latino, PA-C  Anesthesia:   General endotracheal with an interscalene block using Exparel placed preoperatively by the anesthesiologist.  Findings:   As above.  Complications:   None  EBL:   150 cc  Fluids:   1000 cc crystalloid  UOP:   None  TT:   None  Drains:   None  Closure:   Staples  Implants:   All press-fit Arthrex system with a size 7 Apex humeral stem, a 36 mm SutureCup with a +3 insert, and a 24 mm base plate with a 36 mm +4 lateralized glenosphere.  Brief Clinical Note:   The patient is a 66 year old female with a long history of progressively worsening pain and stiffness of her left shoulder. Her symptoms have progressed despite medications, activity modification, etc. Her history and examination are consistent with advanced cuff arthropathy with biceps tendinopathy, all of which were confirmed by preoperative scanning. The patient presents at this time for a reverse left total shoulder arthroplasty with biceps tenodesis.  Procedure:   The patient underwent placement of an interscalene block using Exparel by the anesthesiologist in the preoperative holding area before being brought into the operating room and lain in the supine position. The patient then underwent general endotracheal intubation and anesthesia before the patient was repositioned in the beach chair position using the beach chair positioner. The left shoulder and upper extremity were prepped with ChloraPrep solution before being draped sterilely. Preoperative antibiotics were administered. A timeout was performed to verify the appropriate surgical site.    A  standard anterior approach to the shoulder was made through an approximately 4-5 inch incision. The incision was carried down through the subcutaneous tissues to expose the deltopectoral fascia. The interval between the deltoid and pectoralis muscles was identified and this plane developed, retracting the cephalic vein laterally with the deltoid muscle. The conjoined tendon was identified. Its lateral margin was dissected and the Kolbel self-retraining retractor inserted. The "three sisters" were identified and cauterized. Bursal tissues were removed to improve visualization.   The biceps tendon was identified near the inferior aspect of the bicipital groove. A soft tissue tenodesis was performed by attaching the biceps tendon to the adjacent pectoralis major tendon using two #0 Ethibond interrupted sutures. The biceps tendon was then transected just proximal to the tenodesis site. The subscapularis tendon was released from its attachment to the lesser tuberosity 1 cm proximal to its insertion and several tagging sutures placed. The inferior capsule was released with care after identifying and protecting the axillary nerve. In addition, several osteophytes were removed from the superior and inferior portions of the humeral head. The proximal humeral cut was made at approximately 30 of retroversion using the extra-medullary guide.   Attention was redirected to the glenoid. The labrum was debrided circumferentially before the center of the glenoid was marked with electrocautery.  The glenoid was noted to be moderately retroverted with a preoperative retroversion angle measuring approximately 30 degrees. The guidewire was drilled into the glenoid neck using the 20 degree augmented guide which was rotated to the 4 o'clock position which corresponded to the site of maximal glenoid wear. After verifying its  position, it was overreamed with the baseplate reamer to create a flat surface before the peripheral reamer was  introduced to clean off any peripheral soft tissues. The central 10 x 25 mm coring reamer was then inserted to complete the glenoid preparation. The permanent mini-baseplate construct with a 25 mm modular post attachment was impacted into place. The baseplate was secured using four peripheral screws. Locking screws were placed superiorly, anteriorly, and posteriorly, while one non-locking screw was placed inferiorly to compress the baseplate into the glenoid prior to placing the locking screws. The permanent 36 mm +4 mm lateralized glenosphere was then impacted into place and its Morse taper locking mechanism verified using manual distraction. Finally, the glenosphere locking screw was inserted and tightened securely.  Attention was directed to the humeral side. The humeral canal was reamed with first the 5 mm and then the 6 mm reamer. The canal was broached beginning with a #5 broach and progressing to a #7 broach. This was left in place and the metaphyseal inset reamer was used to create the metaphyseal socket.  A trial reduction performed using the 36 mm trial humeral platform with the +3 mm insert. The arm demonstrated excellent range of motion as the hand could be brought across the chest to the opposite shoulder and brought to the top of the patient's head and to the patient's ear. The shoulder appeared stable throughout this range of motion. The joint was dislocated and the trial components removed. The permanent #7 Apex stem was impacted into place with care taken to maintain the appropriate version. The permanent 36 mm SutureCup humeral platform was then impacted into place before the +3 mm insert was snapped into place. The shoulder was relocated using two finger pressure and again placed through a range of motion with the findings as described above.  The wound was copiously irrigated with sterile saline solution using the jet lavage system before a total of 30 cc of 0.5% Sensorcaine with epinephrine  was injected into the pericapsular and peri-incisional tissues to help with postoperative analgesia. The subscapularis tendon was reapproximated using three #2 FiberWire interrupted sutures. The deltopectoral interval was closed using #0 Vicryl interrupted sutures before the subcutaneous tissues were closed using 2-0 Vicryl interrupted sutures. The skin was closed using staples. Prior to closing the skin, 1 g of transexemic acid in 10 cc of normal saline was injected intra-articularly to help with postoperative bleeding. A sterile occlusive dressing was applied to the wound before the arm was placed into a shoulder immobilizer with an abduction pillow. A Polar Care system also was applied to the shoulder. The patient was then transferred back to a hospital bed before being awakened, extubated, and returned to the recovery room in satisfactory condition after tolerating the procedure well.

## 2022-05-22 NOTE — Anesthesia Preprocedure Evaluation (Addendum)
Anesthesia Evaluation  Patient identified by MRN, date of birth, ID band Patient awake    Reviewed: Allergy & Precautions, H&P , NPO status , Patient's Chart, lab work & pertinent test results, reviewed documented beta blocker date and time   History of Anesthesia Complications Negative for: history of anesthetic complications  Airway Mallampati: II  TM Distance: >3 FB Neck ROM: full    Dental  (+) Dental Advidsory Given, Edentulous Lower, Edentulous Upper   Pulmonary neg shortness of breath, COPD,  COPD inhaler, former smoker    + decreased breath sounds      Cardiovascular Exercise Tolerance: Good hypertension, (-) angina (-) Past MI and (-) CABG Normal cardiovascular exam Rhythm:regular Rate:Normal     Neuro/Psych  PSYCHIATRIC DISORDERS      Multiple back surgeries with a spinal cord stimulator.   negative neurological ROS     GI/Hepatic negative GI ROS, Neg liver ROS,,,  Endo/Other  negative endocrine ROS    Renal/GU negative Renal ROS  negative genitourinary   Musculoskeletal   Abdominal   Peds  Hematology negative hematology ROS (+)   Anesthesia Other Findings Past Medical History: No date: Anxiety No date: Arthritis No date: Depression Past Surgical History: 1981: ABDOMINAL HYSTERECTOMY; N/A  2 in 2001, 2003,  2 in 2005: BACK SURGERY; N/A     Comment:  herniated discs, implant repair work No date: CHOLECYSTECTOMY 09/1975: GALLBLADDER SURGERY; N/A 2018: JOINT REPLACEMENT; Left     Comment:  total hip 2003: SPINAL CORD STIMULATOR IMPLANT; Right 05/28/2017: TOTAL HIP ARTHROPLASTY; Left     Comment:  Procedure: TOTAL HIP ARTHROPLASTY ANTERIOR APPROACH;                Surgeon: Kennedy Bucker, MD;  Location: [facility] ORS;                Service: Orthopedics;  Laterality: Left; BMI    Body Mass Index:  33.66 kg/m     Reproductive/Obstetrics negative OB ROS                              Anesthesia Physical Anesthesia Plan  ASA: III  Anesthesia Plan: General ETT   Post-op Pain Management: Regional block*   Induction: Intravenous  PONV Risk Score and Plan: 4 or greater and Ondansetron, Dexamethasone and Midazolam  Airway Management Planned: Oral ETT  Additional Equipment:   Intra-op Plan:   Post-operative Plan: Extubation in OR  Informed Consent: I have reviewed the patients History and Physical, chart, labs and discussed the procedure including the risks, benefits and alternatives for the proposed anesthesia with the patient or authorized representative who has indicated his/her understanding and acceptance.     Dental Advisory Given  Plan Discussed with: CRNA  Anesthesia Plan Comments: (Patient consented for risks of anesthesia including but not limited to:  - adverse reactions to medications - damage to eyes, teeth, lips or other oral mucosa - nerve damage due to positioning  - sore throat or hoarseness - Damage to heart, brain, nerves, lungs, other parts of body or loss of life  Patient voiced understanding.)        Anesthesia Quick Evaluation

## 2022-05-22 NOTE — ED Triage Notes (Signed)
Pt to room 1 via [facility] with c/o shortness of breath. Pt S/P left shoulder replacement today. Discharged apx 430pm. Pt arrives with sling/immobilizer in place.  Pt reports shortness of breath began apx 6pm today, but she has been coughing " since I woke up from the surgery."  Pt also c/o chest tightness. Pt received 2 duoneb en route and 125mg  Solumedrol.

## 2022-05-22 NOTE — Discharge Instructions (Addendum)
Orthopedic discharge instructions: May shower with intact OpSite dressing once block has worn off (around post-op day #4 - Monday).  Apply ice frequently to shoulder or use Polar Care device. Take Mobic 7.5 mg daily OR ibuprofen 600-800 mg TID with meals for 3-5 days, then as necessary. Take oxycodone as prescribed when needed.  May supplement with ES Tylenol if necessary. Keep shoulder immobilizer on at all times except may remove for bathing purposes. Follow-up in 10-14 days or as scheduled.   AMBULATORY SURGERY  DISCHARGE INSTRUCTIONS   The drugs that you were given will stay in your system until tomorrow so for the next 24 hours you should not:  Drive an automobile Make any legal decisions Drink any alcoholic beverage   You may resume regular meals tomorrow.  Today it is better to start with liquids and gradually work up to solid foods.  You may eat anything you prefer, but it is better to start with liquids, then soup and crackers, and gradually work up to solid foods.   Please notify your doctor immediately if you have any unusual bleeding, trouble breathing, redness and pain at the surgery site, drainage, fever, or pain not relieved by medication.    Additional Instructions:        Please contact your physician with any problems or Same Day Surgery at [phone], Monday through Friday 6 am to 4 pm, or [place] at [facility] number at [phone].

## 2022-05-22 NOTE — Transfer of Care (Signed)
Immediate Anesthesia Transfer of Care Note  Patient: Chana Bode  Procedure(s) Performed: REVERSE SHOULDER ARTHROPLASTY WITH BICEPS TENODESIS (Left: Shoulder)  Patient Location: PACU  Anesthesia Type:General  Level of Consciousness: awake and drowsy  Airway & Oxygen Therapy: Patient Spontanous Breathing and Patient connected to nasal cannula oxygen  Post-op Assessment: Report given to RN  Post vital signs: Reviewed and stable  Last Vitals:  Vitals Value Taken Time  BP 136/73 05/22/22 1331  Temp    Pulse 87 05/22/22 1335  Resp 21 05/22/22 1335  SpO2 100 % 05/22/22 1335  Vitals shown include unvalidated device data.  Last Pain:  Vitals:   05/22/22 0943  TempSrc:   PainSc: 3          Complications: No notable events documented.

## 2022-05-22 NOTE — ED Provider Notes (Signed)
[facility] Provider Note    Event Date/Time   First MD Initiated Contact with Patient 05/22/22 2301     (approximate)   History   Shortness of Breath   HPI  Emma Spears is a 66 y.o. female with history of COPD, hypertension, obesity who presents to the emergency department with complaints of left-sided sharp chest pain and shortness of breath.  Was just discharged from the hospital today after a reverse left total shoulder arthroplasty by Dr. Joice Lofts.  It appears she was intubated.  Was extubated prior to going to the PACU but states since waking up she has had a nonproductive cough and felt "rattling" in her chest.  Symptoms worsened tonight and she called 911.  She received 2 breathing treatments with EMS and states she has had no improvement.  No fevers.  No wheezing.  She does not wear oxygen at home.   History provided by patient and EMS.    Past Medical History:  Diagnosis Date   Anxiety    Arthritis    Depression    Hypertension     Past Surgical History:  Procedure Laterality Date   ABDOMINAL HYSTERECTOMY N/A 1981   BACK SURGERY N/A  2 in 2001, 2003,  2 in 2005   herniated discs, implant repair work   CHOLECYSTECTOMY     COLONOSCOPY W/ POLYPECTOMY     GALLBLADDER SURGERY N/A 09/1975   JOINT REPLACEMENT Left 2018   total hip   SPINAL CORD STIMULATOR IMPLANT Right 2003   TOTAL HIP ARTHROPLASTY Left 05/28/2017   Procedure: TOTAL HIP ARTHROPLASTY ANTERIOR APPROACH;  Surgeon: Kennedy Bucker, MD;  Location: [facility] ORS;  Service: Orthopedics;  Laterality: Left;   TOTAL HIP ARTHROPLASTY Right 09/03/2017   Procedure: TOTAL HIP ARTHROPLASTY ANTERIOR APPROACH;  Surgeon: Kennedy Bucker, MD;  Location: [facility] ORS;  Service: Orthopedics;  Laterality: Right;    MEDICATIONS:  Prior to Admission medications   Medication Sig Start Date End Date Taking? Authorizing Provider  escitalopram (LEXAPRO) 20 MG tablet Take 20 mg by mouth daily.   Yes [provider]  gabapentin (NEURONTIN) 300 MG capsule Take 300 mg by mouth at bedtime.  900mg  bedtime   Yes [provider]  hydrochlorothiazide (HYDRODIURIL) 25 MG tablet Take 25 mg by mouth daily. 03/27/22  Yes [provider]  hydrOXYzine (ATARAX) 25 MG tablet Take 25 mg by mouth daily as needed for anxiety. 03/04/22  Yes [provider]  meloxicam (MOBIC) 7.5 MG tablet Take 7.5 mg by mouth daily.   Yes [provider]  Multiple Vitamin (MULTIVITAMIN WITH MINERALS) TABS tablet Take 1 tablet by mouth daily.   Yes [provider]  traZODone (DESYREL) 50 MG tablet Take 50 mg by mouth at bedtime.   Yes [provider]  acetaminophen (TYLENOL) 325 MG tablet Take 1-2 tablets (325-650 mg total) by mouth every 6 (six) hours as needed for mild pain (pain score 1-3 or temp > 100.5). 09/04/17   09/06/17, PA-C  docusate sodium (COLACE) 100 MG capsule Take 1 capsule (100 mg total) by mouth 2 (two) times daily as needed for mild constipation. 05/22/22   Poggi, 05/24/22, MD  loratadine (CLARITIN) 10 MG tablet Take 10 mg by mouth daily as needed for allergies.    [provider]  oxyCODONE (ROXICODONE) 5 MG immediate release tablet Take 1-2 tablets (5-10 mg total) by mouth every 4 (four) hours as needed for moderate pain or severe pain. 05/22/22  Poggi, Excell SeltzerJohn J, MD    Physical Exam   Triage Vital Signs: ED Triage Vitals  Enc Vitals Group     BP 05/22/22 2258 106/67     Pulse Rate 05/22/22 2248 90     Resp 05/22/22 2248 20     Temp 05/22/22 2258 98.2 F (36.8 C)     Temp Source 05/22/22 2258 Oral     SpO2 05/22/22 2248 98 %     Weight 05/22/22 2249 215 lb (97.5 kg)     Height 05/22/22 2249 5\' 3"  (1.6 m)     Head Circumference --      Peak Flow --      Pain Score --      Pain Loc --      Pain Edu? --      Excl. in GC? --     Most recent vital signs: Vitals:   05/23/22 0300 05/23/22 0318  BP: 105/66   Pulse: 80   Resp: (!) 23    Temp:  97.8 F (36.6 C)  SpO2: 99%     CONSTITUTIONAL: Alert and oriented and responds appropriately to questions.  Obese, appears older than stated age HEAD: Normocephalic, atraumatic EYES: Conjunctivae clear, pupils appear equal, sclera nonicteric ENT: normal nose; moist mucous membranes NECK: Supple, normal ROM CARD: RRR; S1 and S2 appreciated; no murmurs, no clicks, no rubs, no gallops RESP: Normal chest excursion without splinting or tachypnea; breath sounds clear and equal bilaterally; no wheezes, no rhonchi, no rales, no hypoxia or respiratory distress, speaking full sentences, diminished breath sounds diffusely ABD/GI: Normal bowel sounds; non-distended; soft, non-tender, no rebound, no guarding, no peritoneal signs BACK: The back appears normal EXT: Left shoulder is in a sling.  2+ left radial pulse. SKIN: Normal color for age and race; warm; no rash on exposed skin NEURO: Moves all extremities equally, normal speech PSYCH: The patient's mood and manner are appropriate.   ED Results / Procedures / Treatments   LABS: (all labs ordered are listed, but only abnormal results are displayed) Labs Reviewed  CBC WITH DIFFERENTIAL/PLATELET - Abnormal; Notable for the following components:      Result Value   WBC 13.9 (*)    RBC 3.71 (*)    Neutro Abs 11.8 (*)    All other components within normal limits  COMPREHENSIVE METABOLIC PANEL - Abnormal; Notable for the following components:   Glucose, Bld 144 (*)    BUN 33 (*)    Creatinine, Ser 1.47 (*)    Calcium 8.8 (*)    GFR, Estimated 39 (*)    All other components within normal limits  D-DIMER, QUANTITATIVE - Abnormal; Notable for the following components:   D-Dimer, Quant 1.91 (*)    All other components within normal limits  LACTIC ACID, PLASMA - Abnormal; Notable for the following components:   Lactic Acid, Venous 2.6 (*)    All other components within normal limits  LACTIC ACID, PLASMA - Abnormal; Notable for the  following components:   Lactic Acid, Venous 4.7 (*)    All other components within normal limits  RESP PANEL BY RT-PCR (RSV, FLU A&B, COVID)  RVPGX2  CULTURE, BLOOD (ROUTINE X 2)  CULTURE, BLOOD (ROUTINE X 2)  HIV ANTIBODY (ROUTINE TESTING W REFLEX)  PROTIME-INR  CORTISOL-AM, BLOOD  PROCALCITONIN  BASIC METABOLIC PANEL  CBC  LACTIC ACID, PLASMA  LACTIC ACID, PLASMA  TROPONIN I (HIGH SENSITIVITY)  TROPONIN I (HIGH SENSITIVITY)     EKG:  EKG Interpretation  Date/Time:  Thursday May 22 2022 23:08:30 EST Ventricular Rate:  87 PR Interval:  156 QRS Duration: 139 QT Interval:  436 QTC Calculation: 525 R Axis:   -58 Text Interpretation: Sinus rhythm RBBB and LAFB No significant change since last tracing Confirmed by Rochele Raring [phone]) on 05/22/2022 11:12:42 PM         RADIOLOGY: My personal review and interpretation of imaging: CT of the chest shows developing left lower lobe pneumonia.  No PE.  I have personally reviewed all radiology reports.   CT Angio Chest PE W and/or Wo Contrast  Result Date: 05/23/2022 CLINICAL DATA:  Left shoulder replacement yesterday, presents to the ED with shortness of breath, chest discomfort and coughing. Pulmonary embolism suspected. EXAM: CT ANGIOGRAPHY CHEST WITH CONTRAST TECHNIQUE: Multidetector CT imaging of the chest was performed using the standard protocol during bolus administration of intravenous contrast. Multiplanar CT image reconstructions and MIPs were obtained to evaluate the vascular anatomy. RADIATION DOSE REDUCTION: This exam was performed according to the departmental dose-optimization program which includes automated exposure control, adjustment of the mA and/or kV according to patient size and/or use of iterative reconstruction technique. CONTRAST:  49mL OMNIPAQUE IOHEXOL 350 MG/ML SOLN COMPARISON:  Portable chest yesterday, and portable chest and CT chest with contrast both 11/02/2007. FINDINGS: Cardiovascular: There is  mild panchamber cardiomegaly with septal and left ventricular wall hypertrophy. No pericardial effusion is seen. Since 2009 there is increased scattered three-vessel coronary artery calcification. There are increased calcifications in the aortic arch, interval increased aortic tortuosity but no aneurysm, dissection or stenosis. The great vessels are tortuous but appear patent. There is slight prominence of the pulmonary trunk and main arteries with the pulmonary trunk 3.2 cm indicating arterial hypertension, previously was 2.7 cm. No arterial embolism is seen. The pulmonary veins are normal caliber. Mediastinum/Nodes: No enlarged mediastinal, hilar, or axillary lymph nodes. Thyroid gland, trachea, and esophagus demonstrate no significant findings. There is a small hiatal hernia. The main bronchi are patent. Lungs/Pleura: There is no pleural effusion, thickening or pneumothorax. The lungs show mild-to-moderate centrilobular emphysematous change. There is a low inspiration on exam. Thickened bronchi noted both lower lobes without appreciable bronchial plugging. On the left, there is infrahilar consolidation extending over the posteromedial left hemidiaphragm which could be atelectasis or pneumonia and was not seen previously. There is no nodule or further evidence for pulmonary infiltrate. No subpleural fibrosis. There is mild posterior atelectasis in the lung bases. Upper Abdomen: Stable postcholecystectomy common bile duct prominence with common bile duct 13 mm. Fatty atrophy in the visualized pancreas again also noted. Mild hepatic steatosis. No acute findings. Musculoskeletal: Osteopenia noted with degenerative changes of the spine and moderate thoracic kyphosis. The ribcage is intact. Spinal stimulator wiring in the dorsal spinal canal enters from inferiorly with the wiring terminating at T7. There is a reverse left shoulder arthroplasty newly noted. There is patchy soft tissue gas in the left axilla and left  chest wall, soft tissue stranding is also seen in the intermuscular planes of the anterior left chest and scattered along the left upper arm. There is no focal fluid collection or hematoma. There are overlying skin staples. Review of the MIP images confirms the above findings. IMPRESSION: 1. No evidence of arterial embolus. Slight prominence of the pulmonary trunk and main arteries indicating arterial hypertension. 2. Cardiomegaly with septal and left ventricular wall hypertrophy. No findings of acute CHF. 3. Aortic and coronary artery atherosclerosis. 4. COPD with low inspiration and left infrahilar consolidation extending  over the posteromedial left hemidiaphragm which could be atelectasis or pneumonia. Follow-up study recommended to ensure clearing. 5. Lower lobe bronchitis without appreciable bronchial plugging. 6. Small hiatal hernia. 7. Recent reverse left shoulder arthroplasty with patchy, most likely postoperative soft tissue gas in the left axilla and left chest wall, and soft tissue stranding in the intermuscular planes of the anterior left chest and left upper arm. No focal fluid collection or hematoma. Clinical correlation and follow-up recommended. 8. Osteopenia, degenerative changes and moderate thoracic kyphosis. 9. Emphysema. Aortic Atherosclerosis (ICD10-I70.0) and Emphysema (ICD10-J43.9). Electronically Signed   By: Almira Bar M.D.   On: 05/23/2022 00:35   DG Chest Portable 1 View  Result Date: 05/22/2022 CLINICAL DATA:  Shortness of breath. Left shoulder replacement today. EXAM: PORTABLE CHEST 1 VIEW COMPARISON:  11/02/2007 FINDINGS: Shallow inspiration with atelectasis in the left lung base. Heart size and pulmonary vascularity are normal for technique. No pleural effusion or pneumothorax. Calcified and tortuous aorta. Postoperative changes with reverse left shoulder arthroplasty. Stimulator lead tips projected over the midthoracic spine. IMPRESSION: Shallow inspiration with atelectasis  in the left base. Electronically Signed   By: Burman Nieves M.D.   On: 05/22/2022 23:37   DG Shoulder Left Port  Result Date: 05/22/2022 CLINICAL DATA:  Status post reversed left shoulder arthroplasty. EXAM: LEFT SHOULDER COMPARISON:  CT left shoulder 03/21/2022 FINDINGS: Interval reverse left shoulder arthroplasty. No perihardware lucency is seen to indicate hardware failure or loosening. Normal alignment. Expected postoperative air within the superior shoulder. Anterior surgical skin staples. Normal alignment of the acromioclavicular joint. No acute fracture or dislocation. Partial visualization of lower thoracic spine spinal cord stimulator. IMPRESSION: Interval reverse left shoulder arthroplasty without evidence of hardware failure. Electronically Signed   By: Neita Garnet M.D.   On: 05/22/2022 14:33   [place] OR NERVE BLOCK-IMAGE ONLY [facility])  Result Date: 05/22/2022 There is no interpretation for this exam.  This order is for images obtained during a surgical procedure.  Please See "Surgeries" Tab for more information regarding the procedure.     PROCEDURES:  Critical Care performed: Yes, see critical care procedure note(s)   CRITICAL CARE Performed by: Rochele Raring   Total critical care time: 40 minutes  Critical care time was exclusive of separately billable procedures and treating other patients.  Critical care was necessary to treat or prevent imminent or life-threatening deterioration.  Critical care was time spent personally by me on the following activities: development of treatment plan with patient and/or surrogate as well as nursing, discussions with consultants, evaluation of patient's response to treatment, examination of patient, obtaining history from patient or surrogate, ordering and performing treatments and interventions, ordering and review of laboratory studies, ordering and review of radiographic studies, pulse oximetry and re-evaluation of patient's  condition.   Marland Kitchen1-3 Lead EKG Interpretation  Performed by: Averee Harb, Layla Maw, DO Authorized by: Takoya Jonas, Layla Maw, DO     Interpretation: normal     ECG rate:  90   ECG rate assessment: normal     Rhythm: sinus rhythm     Ectopy: none     Conduction: normal       IMPRESSION / MDM / ASSESSMENT AND PLAN / ED COURSE  I reviewed the triage vital signs and the nursing notes.    Patient here with shortness of breath, left-sided atypical chest pain.  The patient is on the cardiac monitor to evaluate for evidence of arrhythmia and/or significant heart rate changes.   DIFFERENTIAL DIAGNOSIS (includes but not  limited to):   PE, pneumonia, COPD exacerbation, viral URI, pneumothorax, CHF   Patient's presentation is most consistent with acute presentation with potential threat to life or bodily function.   PLAN: Will obtain CBC, BMP, D-dimer, troponin, chest x-ray, COVID and flu swabs.  He received 2 breathing treatments with EMS without much relief.  Her lungs show diminished aeration diffusely but no wheezing.   MEDICATIONS GIVEN IN ED: Medications  sodium chloride 0.9 % bolus 1,000 mL ( Intravenous Restarted 05/23/22 0314)  metroNIDAZOLE (FLAGYL) IVPB 500 mg (500 mg Intravenous New Bag/Given 05/23/22 0317)  oxyCODONE (Oxy IR/ROXICODONE) immediate release tablet 5-10 mg (has no administration in time range)  hydrochlorothiazide (HYDRODIURIL) tablet 25 mg (has no administration in time range)  escitalopram (LEXAPRO) tablet 20 mg (has no administration in time range)  hydrOXYzine (ATARAX) tablet 25 mg (has no administration in time range)  traZODone (DESYREL) tablet 50 mg (50 mg Oral Given 05/23/22 0315)  docusate sodium (COLACE) capsule 100 mg (has no administration in time range)  gabapentin (NEURONTIN) capsule 900 mg (900 mg Oral Given 05/23/22 0314)  multivitamin with minerals tablet 1 tablet (has no administration in time range)  loratadine (CLARITIN) tablet 10 mg (has no  administration in time range)  enoxaparin (LOVENOX) injection 50 mg (has no administration in time range)  0.9 %  sodium chloride infusion (has no administration in time range)  cefTRIAXone (ROCEPHIN) 2 g in sodium chloride 0.9 % 100 mL IVPB (has no administration in time range)  azithromycin (ZITHROMAX) 500 mg in sodium chloride 0.9 % 250 mL IVPB (has no administration in time range)  acetaminophen (TYLENOL) tablet 650 mg (has no administration in time range)    Or  acetaminophen (TYLENOL) suppository 650 mg (has no administration in time range)  traZODone (DESYREL) tablet 25 mg (has no administration in time range)  magnesium hydroxide (MILK OF MAGNESIA) suspension 30 mL (has no administration in time range)  ondansetron (ZOFRAN) tablet 4 mg (has no administration in time range)    Or  ondansetron (ZOFRAN) injection 4 mg (has no administration in time range)  guaiFENesin (MUCINEX) 12 hr tablet 600 mg (600 mg Oral Given 05/23/22 0314)  ipratropium-albuterol (DUONEB) 0.5-2.5 (3) MG/3ML nebulizer solution 3 mL (has no administration in time range)  fentaNYL (SUBLIMAZE) injection 50 mcg (50 mcg Intravenous Given 05/22/22 2338)  ondansetron (ZOFRAN) injection 4 mg (4 mg Intravenous Given 05/22/22 2338)  sodium chloride 0.9 % bolus 1,000 mL (0 mLs Intravenous Stopped 05/23/22 0020)  iohexol (OMNIPAQUE) 350 MG/ML injection 75 mL (75 mLs Intravenous Contrast Given 05/22/22 2355)  cefTRIAXone (ROCEPHIN) 2 g in sodium chloride 0.9 % 100 mL IVPB (0 g Intravenous Stopped 05/23/22 0206)  azithromycin (ZITHROMAX) 500 mg in sodium chloride 0.9 % 250 mL IVPB (0 mg Intravenous Stopped 05/23/22 0313)     ED COURSE: Patient's labs show leukocytosis of 13,000 with left shift.  Creatinine elevated at 1.47 which is up from her baseline.  Troponin negative.  COVID, flu and RSV negative.  Chest x-ray reviewed and interpreted by myself and the radiologist and shows no pneumonia or edema.  D-dimer elevated.  Will  proceed with CTA of the chest.  Lactic elevated at 2.6.  Less likely from sepsis and more likely from recent albuterol treatments but will give IV fluids.    CT scan reviewed and interpreted by myself and the radiologist and shows no PE but does show developing left lower lobe pneumonia and bronchitis.  She is hypoxic with ambulation in  the room to 86%.  Will admit to the hospitalist service.  Will give Rocephin and azithromycin to cover for community-acquired pneumonia but also Flagyl to cover for aspiration.  Patient is comfortable with this plan.   CONSULTS:  Consulted and discussed patient's case with hospitalist, Dr. Arville Care.  I have recommended admission and consulting physician agrees and will place admission orders.  Patient (and family if present) agree with this plan.   I reviewed all nursing notes, vitals, pertinent previous records.  All labs, EKGs, imaging ordered have been independently reviewed and interpreted by myself.    OUTSIDE RECORDS REVIEWED: Reviewed patient's recent operative notes.       FINAL CLINICAL IMPRESSION(S) / ED DIAGNOSES   Final diagnoses:  Pneumonia of left lower lobe due to infectious organism  Acute respiratory failure with hypoxia (HCC)     Rx / DC Orders   ED Discharge Orders     None        Note:  This document was prepared using Dragon voice recognition software and may include unintentional dictation errors.   Antoino Westhoff, Layla Maw, DO 05/23/22 [id number]

## 2022-05-22 NOTE — H&P (Signed)
History of Present Illness: Emma Spears is a 66 y.o. female who presents for follow-up of her left shoulder pain secondary to advanced degenerative joint disease. She notes little change in her symptoms since her last visit 1 month ago. She continues to complain of moderate to severe pain in the shoulder which she rates at 7/10 on today's visit. She has been taking arthritic strength Tylenol, Mobic, and applying heat as necessary with limited benefit. She notes that her pain is worse with certain movements. She is not able to perform activities at or above shoulder level, and has pain at night, especially if she sleeps on her left side. She denies any reinjury to her shoulder since her last visit, and denies any numbness or paresthesias down her arm to her hand. She is ready to proceed with more definitive management of her symptoms.  Current Outpatient Medications: escitalopram oxalate (LEXAPRO) 20 MG tablet Take 20 mg by mouth once daily. 3  gabapentin (NEURONTIN) 300 MG capsule Take 300 mg by mouth 3 (three) times daily TAKE 3 CAPSULES BY MOUTH 3 TIMES DAILY 3  hydroCHLOROthiazide (HYDRODIURIL) 25 MG tablet Take 25 mg by mouth once daily  hydrOXYzine (ATARAX) 25 MG tablet Take 25 mg by mouth once as needed for Anxiety  meloxicam (MOBIC) 7.5 MG tablet TAKE 1 TABLET BY MOUTH ONCE A DAY FOR JOINT PAIN  traZODone (DESYREL) 50 MG tablet TAKE 1 TABLET BY MOUTH AT BEDTIME FOR INSOMNIA   Allergies:  Hydrocodone-Acetaminophen Nausea   Past Medical History:  Chicken pox  Hypertension   Past Surgical History:  Total hip arthroplasty ant. approach Left 05/28/2017 (Dr. Rosita Kea)  Total hip arthroplasty ant. approach Right 09/03/2017 (Dr. Rosita Kea)  LUMBAR SURGERY   Family History:  High blood pressure (Hypertension) Mother  High blood pressure (Hypertension) Father  No Known Problems Sister  No Known Problems Brother   Social History:   Socioeconomic History:  Marital status: Widowed  Tobacco Use   Smoking status: Former  Smokeless tobacco: Never  Advertising account planner Use: Former  Substance and Sexual Activity  Alcohol use: No  Alcohol/week: 0.0 standard drinks of alcohol  Drug use: No  Sexual activity: Never  Partners: Male   Review of Systems:  A comprehensive 14 point ROS was performed, reviewed, and the pertinent orthopaedic findings are documented in the HPI.  Physical Exam: Vitals:  05/12/22 1357  BP: 132/86  Weight: 97 kg (213 lb 12.8 oz)  Height: 160 cm (5\' 3" )  PainSc: 7  PainLoc: Shoulder   General/Constitutional: Pleasant significantly overweight middle-aged female in no acute distress. Neuro/Psych: Normal mood and affect, oriented to person, place and time. Eyes: Non-icteric. Pupils are equal, round, and reactive to light, and exhibit synchronous movement. ENT: Unremarkable. Lymphatic: No palpable adenopathy. Respiratory: Lungs clear to auscultation, Normal chest excursion, No wheezes, and Non-labored breathing Cardiovascular: Regular rate and rhythm. No murmurs. and No edema, swelling or tenderness, except as noted in detailed exam. Integumentary: No impressive skin lesions present, except as noted in detailed exam. Musculoskeletal: Unremarkable, except as noted in detailed exam.  Left shoulder exam: SKIN: normal SWELLING: none WARMTH: none LYMPH NODES: no adenopathy palpable CREPITUS: glenohumeral joint TENDERNESS: Mild-moderate tenderness over lateral aspect of shoulder ROM (active):  Forward flexion: 30 degrees Abduction: 20 degrees Internal rotation: Iliac crest ROM (passive):  Forward flexion: 60 degrees Abduction: 40 degrees  ER/IR at 90 abd: Not evaluated  She describes significant pain at the extremes of all motions.  STRENGTH: Forward flexion: Not tested  today Abduction: 4/5 External rotation: 4/5 Internal rotation: 4-4+/5 Pain with RC testing: Mild-moderate pain with resisted strength testing.  STABILITY: Normal  SPECIAL TESTS:  Juanetta Gosling' test: Not evaluated Speed's test: Not evaluated Capsulitis - pain w/ passive ER: no Crossed arm test: Not evaluated Crank: Not evaluated Anterior apprehension: Not evaluated Posterior apprehension: Not evaluated  She is neurovascularly intact to the left upper extremity.  Shoulder Imaging: Recent true AP, Y-scapular, and axillary views of the left shoulder are available for review and have been reviewed by myself. These films demonstrate significant degenerative changes with complete loss of the glenohumeral joint space. In addition, there are significant circumferential osteophytes around the humeral head and neck which appear to impinge on the glenoid with range of motion. The subacromial space is mildly decreased. There is no subacromial or infra-clavicular spurring. She demonstrates a Type II acromion.  Shoulder Imaging, CT: Left Shoulder: A recent CT scan of the left shoulder also is available for review and has been reviewed by myself. By report, the study demonstrates evidence of "severe osteoarthritis of the glenohumeral joint". No lytic lesions or fractures are identified.  Assessment: Encounter Diagnosis  Name Primary?  Primary osteoarthritis of left shoulder Yes   Plan: The treatment options were discussed with the patient and her daughter. In addition, patient educational materials were provided regarding the diagnosis and treatment options. The patient is ready to proceed with surgical intervention, specifically a reverse left total shoulder arthroplasty. The procedure was discussed with the patient, as were the potential risks (including bleeding, infection, nerve and/or blood vessel injury, persistent or recurrent pain, loosening and/or failure of the components, dislocation, need for further surgery, blood clots, strokes, heart attacks and/or arhythmias, pneumonia, etc.) and benefits. The patient states her understanding and wishes to proceed. All of the patient's  questions and concerns were answered. She can call any time with further concerns. She will follow up post-surgery, routine.    H&P reviewed and patient re-examined. No changes.

## 2022-05-23 ENCOUNTER — Encounter: Payer: Self-pay | Admitting: Family Medicine

## 2022-05-23 DIAGNOSIS — J690 Pneumonitis due to inhalation of food and vomit: Secondary | ICD-10-CM | POA: Diagnosis present

## 2022-05-23 DIAGNOSIS — A419 Sepsis, unspecified organism: Secondary | ICD-10-CM | POA: Diagnosis not present

## 2022-05-23 DIAGNOSIS — N179 Acute kidney failure, unspecified: Secondary | ICD-10-CM

## 2022-05-23 DIAGNOSIS — F329 Major depressive disorder, single episode, unspecified: Secondary | ICD-10-CM | POA: Insufficient documentation

## 2022-05-23 DIAGNOSIS — J9601 Acute respiratory failure with hypoxia: Secondary | ICD-10-CM

## 2022-05-23 DIAGNOSIS — I10 Essential (primary) hypertension: Secondary | ICD-10-CM | POA: Insufficient documentation

## 2022-05-23 DIAGNOSIS — J189 Pneumonia, unspecified organism: Secondary | ICD-10-CM

## 2022-05-23 LAB — HIV ANTIBODY (ROUTINE TESTING W REFLEX): HIV Screen 4th Generation wRfx: NONREACTIVE

## 2022-05-23 LAB — CBC
HCT: 31.6 % — ABNORMAL LOW (ref 36.0–46.0)
Hemoglobin: 10.3 g/dL — ABNORMAL LOW (ref 12.0–15.0)
MCH: 32.3 pg (ref 26.0–34.0)
MCHC: 32.6 g/dL (ref 30.0–36.0)
MCV: 99.1 fL (ref 80.0–100.0)
Platelets: 166 10*3/uL (ref 150–400)
RBC: 3.19 MIL/uL — ABNORMAL LOW (ref 3.87–5.11)
RDW: 13.2 % (ref 11.5–15.5)
WBC: 11 10*3/uL — ABNORMAL HIGH (ref 4.0–10.5)
nRBC: 0 % (ref 0.0–0.2)

## 2022-05-23 LAB — TROPONIN I (HIGH SENSITIVITY): Troponin I (High Sensitivity): 10 ng/L (ref <18)

## 2022-05-23 LAB — BASIC METABOLIC PANEL
Anion gap: 6 (ref 5–15)
BUN: 27 mg/dL — ABNORMAL HIGH (ref 8–23)
CO2: 24 mmol/L (ref 22–32)
Calcium: 7.9 mg/dL — ABNORMAL LOW (ref 8.9–10.3)
Chloride: 110 mmol/L (ref 98–111)
Creatinine, Ser: 1.18 mg/dL — ABNORMAL HIGH (ref 0.44–1.00)
GFR, Estimated: 51 mL/min — ABNORMAL LOW (ref >60)
Glucose, Bld: 149 mg/dL — ABNORMAL HIGH (ref 70–99)
Potassium: 4 mmol/L (ref 3.5–5.1)
Sodium: 140 mmol/L (ref 135–145)

## 2022-05-23 LAB — PROTIME-INR
INR: 1 (ref 0.8–1.2)
Prothrombin Time: 13.5 seconds (ref 11.4–15.2)

## 2022-05-23 LAB — LACTIC ACID, PLASMA
Lactic Acid, Venous: 4.7 mmol/L (ref 0.5–1.9)
Lactic Acid, Venous: 2.6 mmol/L (ref 0.5–1.9)
Lactic Acid, Venous: 2.4 mmol/L (ref 0.5–1.9)

## 2022-05-23 LAB — CORTISOL-AM, BLOOD: Cortisol - AM: 2.5 ug/dL — ABNORMAL LOW (ref 6.7–22.6)

## 2022-05-23 LAB — PROCALCITONIN: Procalcitonin: <0.1 ng/mL

## 2022-05-23 MED ORDER — OXYCODONE HCL 5 MG PO TABS
5.0000 mg | ORAL_TABLET | ORAL | Status: DC | PRN
  Administered 2022-05-23: 5 mg via ORAL
  Filled 2022-05-23: qty 1

## 2022-05-23 MED ORDER — AMOXICILLIN-POT CLAVULANATE 875-125 MG PO TABS: 1.0000 | ORAL_TABLET | Freq: Two times a day (BID) | ORAL | 0 refills | Status: AC

## 2022-05-23 MED ORDER — METRONIDAZOLE 500 MG/100ML IV SOLN
500.0000 mg | Freq: Once | INTRAVENOUS | Status: AC
  Administered 2022-05-23: 500 mg via INTRAVENOUS
  Filled 2022-05-23: qty 100

## 2022-05-23 MED ORDER — LORATADINE 10 MG PO TABS: 10.0000 mg | ORAL_TABLET | Freq: Every day | ORAL | Status: DC | PRN

## 2022-05-23 MED ORDER — GUAIFENESIN ER 600 MG PO TB12: 600.0000 mg | ORAL_TABLET | Freq: Two times a day (BID) | ORAL | 0 refills | Status: DC

## 2022-05-23 MED ORDER — ACETAMINOPHEN 650 MG RE SUPP: 650.0000 mg | Freq: Four times a day (QID) | RECTAL | Status: DC | PRN

## 2022-05-23 MED ORDER — GABAPENTIN 300 MG PO CAPS
900.0000 mg | ORAL_CAPSULE | Freq: Every day | ORAL | Status: DC
  Administered 2022-05-23: 900 mg via ORAL
  Filled 2022-05-23: qty 3

## 2022-05-23 MED ORDER — ONDANSETRON HCL 4 MG PO TABS: 4.0000 mg | ORAL_TABLET | Freq: Four times a day (QID) | ORAL | Status: DC | PRN

## 2022-05-23 MED ORDER — ENOXAPARIN SODIUM 60 MG/0.6ML IJ SOSY
0.5000 mg/kg | PREFILLED_SYRINGE | INTRAMUSCULAR | Status: DC
  Administered 2022-05-23: 50 mg via SUBCUTANEOUS
  Filled 2022-05-23: qty 0.6

## 2022-05-23 MED ORDER — SODIUM CHLORIDE 0.9 % IV SOLN: 3.0000 g | Freq: Four times a day (QID) | INTRAVENOUS | Status: DC

## 2022-05-23 MED ORDER — IPRATROPIUM-ALBUTEROL 0.5-2.5 (3) MG/3ML IN SOLN
3.0000 mL | Freq: Four times a day (QID) | RESPIRATORY_TRACT | Status: DC
  Administered 2022-05-23 (×3): 3 mL via RESPIRATORY_TRACT
  Filled 2022-05-23 (×3): qty 3

## 2022-05-23 MED ORDER — ACETAMINOPHEN 325 MG PO TABS: 650.0000 mg | ORAL_TABLET | Freq: Four times a day (QID) | ORAL | Status: DC | PRN

## 2022-05-23 MED ORDER — GUAIFENESIN ER 600 MG PO TB12
600.0000 mg | ORAL_TABLET | Freq: Two times a day (BID) | ORAL | Status: DC
  Administered 2022-05-23 (×2): 600 mg via ORAL
  Filled 2022-05-23 (×2): qty 1

## 2022-05-23 MED ORDER — AMOXICILLIN-POT CLAVULANATE 875-125 MG PO TABS: 1.0000 | ORAL_TABLET | Freq: Two times a day (BID) | ORAL | Status: DC

## 2022-05-23 MED ORDER — HYDROCHLOROTHIAZIDE 25 MG PO TABS: 25.0000 mg | ORAL_TABLET | Freq: Every day | ORAL | 0 refills | Status: AC

## 2022-05-23 MED ORDER — SODIUM CHLORIDE 0.9 % IV BOLUS (SEPSIS)
1000.0000 mL | Freq: Once | INTRAVENOUS | Status: AC
  Administered 2022-05-23: 1000 mL via INTRAVENOUS

## 2022-05-23 MED ORDER — SODIUM CHLORIDE 0.9 % IV SOLN
2.0000 g | Freq: Once | INTRAVENOUS | Status: AC
  Administered 2022-05-23: 2 g via INTRAVENOUS
  Filled 2022-05-23: qty 20

## 2022-05-23 MED ORDER — ADULT MULTIVITAMIN W/MINERALS CH
1.0000 | ORAL_TABLET | Freq: Every day | ORAL | Status: DC
  Administered 2022-05-23: 1 via ORAL
  Filled 2022-05-23: qty 1

## 2022-05-23 MED ORDER — GUAIFENESIN-CODEINE 100-10 MG/5ML PO SOLN: 5.0000 mL | Freq: Three times a day (TID) | ORAL | 0 refills | Status: DC | PRN

## 2022-05-23 MED ORDER — DOCUSATE SODIUM 100 MG PO CAPS: 100.0000 mg | ORAL_CAPSULE | Freq: Two times a day (BID) | ORAL | Status: DC | PRN

## 2022-05-23 MED ORDER — TRAZODONE HCL 50 MG PO TABS
50.0000 mg | ORAL_TABLET | Freq: Every day | ORAL | Status: DC
  Administered 2022-05-23: 50 mg via ORAL
  Filled 2022-05-23: qty 1

## 2022-05-23 MED ORDER — TRAZODONE HCL 50 MG PO TABS: 25.0000 mg | ORAL_TABLET | Freq: Every evening | ORAL | Status: DC | PRN

## 2022-05-23 MED ORDER — HYDROXYZINE HCL 25 MG PO TABS: 25.0000 mg | ORAL_TABLET | Freq: Every day | ORAL | Status: DC | PRN

## 2022-05-23 MED ORDER — SODIUM CHLORIDE 0.9 % IV SOLN
500.0000 mg | Freq: Once | INTRAVENOUS | Status: AC
  Administered 2022-05-23: 500 mg via INTRAVENOUS
  Filled 2022-05-23: qty 5

## 2022-05-23 MED ORDER — SODIUM CHLORIDE 0.9 % IV SOLN: INTRAVENOUS | Status: DC

## 2022-05-23 MED ORDER — SODIUM CHLORIDE 0.9 % IV SOLN: 2.0000 g | INTRAVENOUS | Status: DC

## 2022-05-23 MED ORDER — HYDROCHLOROTHIAZIDE 25 MG PO TABS: 25.0000 mg | ORAL_TABLET | Freq: Every day | ORAL | Status: DC

## 2022-05-23 MED ORDER — SODIUM CHLORIDE 0.9 % IV SOLN: 500.0000 mg | INTRAVENOUS | Status: DC

## 2022-05-23 MED ORDER — MAGNESIUM HYDROXIDE 400 MG/5ML PO SUSP: 30.0000 mL | Freq: Every day | ORAL | Status: DC | PRN

## 2022-05-23 MED ORDER — ONDANSETRON HCL 4 MG/2ML IJ SOLN: 4.0000 mg | Freq: Four times a day (QID) | INTRAMUSCULAR | Status: DC | PRN

## 2022-05-23 MED ORDER — ESCITALOPRAM OXALATE 10 MG PO TABS
20.0000 mg | ORAL_TABLET | Freq: Every day | ORAL | Status: DC
  Administered 2022-05-23: 20 mg via ORAL
  Filled 2022-05-23: qty 2

## 2022-05-23 NOTE — Discharge Summary (Signed)
Physician Discharge Summary   Patient: Emma Spears MRN: [id number] DOB: 1955-12-26  Admit date:     05/22/2022  Discharge date: 05/23/22  Discharge Physician: Enedina Finner   PCP: [facility]   Recommendations at discharge:    F/u PCP in one week follow-up Dr. Joice Lofts scheduled appointment  Discharge Diagnoses: Principal Problem:   Aspiration pneumonia [facility]) Active Problems:   Sepsis due to pneumonia [facility])   AKI (acute kidney injury) (HCC)   Major depression   Essential hypertension   Acute respiratory failure with hypoxia [facility])  Hospital Course: MIIA BLANKS is a 66 y.o. female with medical history significant for anxiety and depression, osteoarthritis and hypertension, who presented to the emergency room with acute onset of dyspnea with associated left-sided chest pain as well as dry cough with occasional wheezing.  She denies any fever or chills.   Aspiration pneumonitis status post left shoulder arthroplasty acute hypoxic respiratory failure-- resolved -- patient came in postop after she went home with cough and shortness of breath. CT chest was done showed left infra-hilar pneumonia/pneumonitis. No PE. -Patient was switch to IV unasyn -- lactic acid trending down. White count 11 K. Patient overall maintaining sats more than 93% on room air. Able to complete sentence without shortness of breath -- blood culture negative -- PRN cough meds -- will switch to oral antibiotic. -- Patient okay to discharge go home. Discussed with patient's daughter on the phone agreeable.  Sepsis due to pneumonia -- resolved  Hypertension -- relative low BP -- I have asked patient to hold her hydrochlorothiazide for few days and resume taking once SBP greater than 120  Status post left shoulder arthroplasty -- continue. Instructions given by orthopedic. Follow-up outpatient on your scheduled appointment.  Above was discussed with patient's daughter on the  phone. She is in agreement. Patient will discharge to home.      Pain control - [employer] Controlled Substance Reporting System database was reviewed. and patient was instructed, not to drive, operate heavy machinery, perform activities at heights, swimming or participation in water activities or provide baby-sitting services while on Pain, Sleep and Anxiety Medications; until their outpatient Physician has advised to do so again. Also recommended to not to take more than prescribed Pain, Sleep and Anxiety Medications.    Disposition: Home Diet recommendation:  Discharge Diet Orders (From admission, onward)     Start     Ordered   05/23/22 0000  Diet - low sodium heart healthy        05/23/22 1404           Cardiac diet DISCHARGE MEDICATION: Allergies as of 05/23/2022       Reactions   Vicodin [hydrocodone-acetaminophen] Nausea And Vomiting        Medication List     TAKE these medications    acetaminophen 325 MG tablet Commonly known as: TYLENOL Take 1-2 tablets (325-650 mg total) by mouth every 6 (six) hours as needed for mild pain (pain score 1-3 or temp > 100.5).   amoxicillin-clavulanate 875-125 MG tablet Commonly known as: AUGMENTIN Take 1 tablet by mouth every 12 (twelve) hours for 5 days.   docusate sodium 100 MG capsule Commonly known as: COLACE Take 1 capsule (100 mg total) by mouth 2 (two) times daily as needed for mild constipation.   escitalopram 20 MG tablet Commonly known as: LEXAPRO Take 20 mg by mouth daily.   gabapentin 300 MG capsule Commonly known as: NEURONTIN Take 300  mg by mouth at bedtime.  900mg  bedtime   guaiFENesin 600 MG 12 hr tablet Commonly known as: MUCINEX Take 1 tablet (600 mg total) by mouth 2 (two) times daily.   guaiFENesin-codeine 100-10 MG/5ML syrup Take 5 mLs by mouth 3 (three) times daily as needed for cough.   hydrochlorothiazide 25 MG tablet Commonly known as: HYDRODIURIL Take 1 tablet (25 mg total) by  mouth daily. Start taking on: May 27, 2022 What changed: These instructions start on May 27, 2022. If you are unsure what to do until then, ask your doctor or other care provider.   hydrOXYzine 25 MG tablet Commonly known as: ATARAX Take 25 mg by mouth daily as needed for anxiety.   loratadine 10 MG tablet Commonly known as: CLARITIN Take 10 mg by mouth daily as needed for allergies.   meloxicam 7.5 MG tablet Commonly known as: MOBIC Take 7.5 mg by mouth daily.   multivitamin with minerals Tabs tablet Take 1 tablet by mouth daily.   oxyCODONE 5 MG immediate release tablet Commonly known as: Roxicodone Take 1-2 tablets (5-10 mg total) by mouth every 4 (four) hours as needed for moderate pain or severe pain.   traZODone 50 MG tablet Commonly known as: DESYREL Take 50 mg by mouth at bedtime.        Follow-up Information     [facility], [facility]. Schedule an appointment as soon as possible for a visit in 1 week(s).   Specialty: General Practice Why: 1 week Contact information: [address] [place] [phone]         Poggi, [phone], MD. Go to.   Specialty: Orthopedic Surgery Why: on your scheduled f/u appt Contact information: [address] [facility] [place] [phone]                Discharge Exam: [phone] Weights   05/22/22 2249  Weight: 97.5 kg     Condition at discharge: fair  The results of significant diagnostics from this hospitalization (including imaging, microbiology, ancillary and laboratory) are listed below for reference.   Imaging Studies: CT Angio Chest PE W and/or Wo Contrast  Result Date: 05/23/2022 CLINICAL DATA:  Left shoulder replacement yesterday, presents to the ED with shortness of breath, chest discomfort and coughing. Pulmonary embolism suspected. EXAM: CT ANGIOGRAPHY CHEST WITH CONTRAST TECHNIQUE: Multidetector CT imaging of the  chest was performed using the standard protocol during bolus administration of intravenous contrast. Multiplanar CT image reconstructions and MIPs were obtained to evaluate the vascular anatomy. RADIATION DOSE REDUCTION: This exam was performed according to the departmental dose-optimization program which includes automated exposure control, adjustment of the mA and/or kV according to patient size and/or use of iterative reconstruction technique. CONTRAST:  69mL OMNIPAQUE IOHEXOL 350 MG/ML SOLN COMPARISON:  Portable chest yesterday, and portable chest and CT chest with contrast both 11/02/2007. FINDINGS: Cardiovascular: There is mild panchamber cardiomegaly with septal and left ventricular wall hypertrophy. No pericardial effusion is seen. Since 2009 there is increased scattered three-vessel coronary artery calcification. There are increased calcifications in the aortic arch, interval increased aortic tortuosity but no aneurysm, dissection or stenosis. The great vessels are tortuous but appear patent. There is slight prominence of the pulmonary trunk and main arteries with the pulmonary trunk 3.2 cm indicating arterial hypertension, previously was 2.7 cm. No arterial embolism is seen. The pulmonary veins are normal caliber. Mediastinum/Nodes: No enlarged mediastinal, hilar, or axillary lymph nodes. Thyroid gland, trachea, and  esophagus demonstrate no significant findings. There is a small hiatal hernia. The main bronchi are patent. Lungs/Pleura: There is no pleural effusion, thickening or pneumothorax. The lungs show mild-to-moderate centrilobular emphysematous change. There is a low inspiration on exam. Thickened bronchi noted both lower lobes without appreciable bronchial plugging. On the left, there is infrahilar consolidation extending over the posteromedial left hemidiaphragm which could be atelectasis or pneumonia and was not seen previously. There is no nodule or further evidence for pulmonary infiltrate. No  subpleural fibrosis. There is mild posterior atelectasis in the lung bases. Upper Abdomen: Stable postcholecystectomy common bile duct prominence with common bile duct 13 mm. Fatty atrophy in the visualized pancreas again also noted. Mild hepatic steatosis. No acute findings. Musculoskeletal: Osteopenia noted with degenerative changes of the spine and moderate thoracic kyphosis. The ribcage is intact. Spinal stimulator wiring in the dorsal spinal canal enters from inferiorly with the wiring terminating at T7. There is a reverse left shoulder arthroplasty newly noted. There is patchy soft tissue gas in the left axilla and left chest wall, soft tissue stranding is also seen in the intermuscular planes of the anterior left chest and scattered along the left upper arm. There is no focal fluid collection or hematoma. There are overlying skin staples. Review of the MIP images confirms the above findings. IMPRESSION: 1. No evidence of arterial embolus. Slight prominence of the pulmonary trunk and main arteries indicating arterial hypertension. 2. Cardiomegaly with septal and left ventricular wall hypertrophy. No findings of acute CHF. 3. Aortic and coronary artery atherosclerosis. 4. COPD with low inspiration and left infrahilar consolidation extending over the posteromedial left hemidiaphragm which could be atelectasis or pneumonia. Follow-up study recommended to ensure clearing. 5. Lower lobe bronchitis without appreciable bronchial plugging. 6. Small hiatal hernia. 7. Recent reverse left shoulder arthroplasty with patchy, most likely postoperative soft tissue gas in the left axilla and left chest wall, and soft tissue stranding in the intermuscular planes of the anterior left chest and left upper arm. No focal fluid collection or hematoma. Clinical correlation and follow-up recommended. 8. Osteopenia, degenerative changes and moderate thoracic kyphosis. 9. Emphysema. Aortic Atherosclerosis (ICD10-I70.0) and Emphysema  (ICD10-J43.9). Electronically Signed   By: Almira BarKeith  Chesser M.D.   On: 05/23/2022 00:35   DG Chest Portable 1 View  Result Date: 05/22/2022 CLINICAL DATA:  Shortness of breath. Left shoulder replacement today. EXAM: PORTABLE CHEST 1 VIEW COMPARISON:  11/02/2007 FINDINGS: Shallow inspiration with atelectasis in the left lung base. Heart size and pulmonary vascularity are normal for technique. No pleural effusion or pneumothorax. Calcified and tortuous aorta. Postoperative changes with reverse left shoulder arthroplasty. Stimulator lead tips projected over the midthoracic spine. IMPRESSION: Shallow inspiration with atelectasis in the left base. Electronically Signed   By: Burman NievesWilliam  Stevens M.D.   On: 05/22/2022 23:37   DG Shoulder Left Port  Result Date: 05/22/2022 CLINICAL DATA:  Status post reversed left shoulder arthroplasty. EXAM: LEFT SHOULDER COMPARISON:  CT left shoulder 03/21/2022 FINDINGS: Interval reverse left shoulder arthroplasty. No perihardware lucency is seen to indicate hardware failure or loosening. Normal alignment. Expected postoperative air within the superior shoulder. Anterior surgical skin staples. Normal alignment of the acromioclavicular joint. No acute fracture or dislocation. Partial visualization of lower thoracic spine spinal cord stimulator. IMPRESSION: Interval reverse left shoulder arthroplasty without evidence of hardware failure. Electronically Signed   By: Neita Garnetonald  Viola M.D.   On: 05/22/2022 14:33   US OR NERVE BLOCK-IMAGE ONLY [facility])  Result Date: 05/22/2022 There is no interpretation for  this exam.  This order is for images obtained during a surgical procedure.  Please See "Surgeries" Tab for more information regarding the procedure.    Microbiology: Results for orders placed or performed during the hospital encounter of 05/22/22  Resp panel by RT-PCR (RSV, Flu A&B, Covid) Anterior Nasal Swab     Status: None   Collection Time: 05/22/22 10:54 PM   Specimen:  Anterior Nasal Swab  Result Value Ref Range Status   SARS Coronavirus 2 by RT PCR NEGATIVE NEGATIVE Final   Influenza A by PCR NEGATIVE NEGATIVE Final   Influenza B by PCR NEGATIVE NEGATIVE Final   Resp Syncytial Virus by PCR NEGATIVE NEGATIVE Final    Comment: Performed at [facility], [address] [place], [place] [address]    Labs: CBC: Recent Labs  Lab 05/22/22 2254 05/23/22 0459  WBC 13.9* 11.0*  NEUTROABS 11.8*  --   HGB 12.2 10.3*  HCT 36.0 31.6*  MCV 97.0 99.1  PLT 232 166   Basic Metabolic Panel: Recent Labs  Lab 05/22/22 2254 05/23/22 0459  NA 140 140  K 3.9 4.0  CL 105 110  CO2 26 24  GLUCOSE 144* 149*  BUN 33* 27*  CREATININE 1.47* 1.18*  CALCIUM 8.8* 7.9*   Liver Function Tests: Recent Labs  Lab 05/22/22 2254  AST 27  ALT 13  ALKPHOS 62  BILITOT 0.7  PROT 7.1  ALBUMIN 3.9   CBG: No results for input(s): "GLUCAP" in the last 168 hours.  Discharge time spent: greater than 30 minutes.  Signed: Enedina Finner, MD [facility] 05/23/2022

## 2022-05-23 NOTE — Care Management CC44 (Signed)
Condition Code 44 Documentation Completed  Patient Details  Name: Emma Spears MRN: [id number] Date of Birth: 10/05/1955   Condition Code 44 given:  Yes Patient signature on Condition Code 44 notice:  Yes Documentation of 2 MD's agreement:  Yes Code 44 added to claim:  Yes    Allayne Butcher, RN 05/23/2022, 3:03 PM

## 2022-05-23 NOTE — Assessment & Plan Note (Signed)
-   We will hold off at Td given acute kidney injury and placed on as needed IV labetalol.

## 2022-05-23 NOTE — ED Notes (Signed)
Pt up with 2 person assist and walker. Pt ambulated apx 20 feet on room air. 02 sat noted to drop to 86%. Pt placed back in bed, 02 applied at 2L via [place] with improvement of sat to 97%. MD ward made aware of ambulatory sat at this time

## 2022-05-23 NOTE — ED Notes (Signed)
Assumed care from Heather, RN. Pt resting comfortably in bed at this time. Pt denies any current needs or questions. Call light with in reach.   

## 2022-05-23 NOTE — Progress Notes (Signed)
PHARMACIST - PHYSICIAN COMMUNICATION  CONCERNING:  Enoxaparin (Lovenox) for DVT Prophylaxis    RECOMMENDATION: Patient was prescribed enoxaprin 40mg  q24 hours for VTE prophylaxis.   Filed Weights   05/22/22 2249  Weight: 97.5 kg (215 lb)    Body mass index is 38.09 kg/m.  Estimated Creatinine Clearance: 41.8 mL/min (A) (by C-G formula based on SCr of 1.47 mg/dL (H)).   Based on [facility] policy patient is candidate for enoxaparin 0.5mg /kg TBW SQ every 24 hours based on BMI being >30.  DESCRIPTION: Pharmacy has adjusted enoxaparin dose per [facility] policy.  Patient is now receiving enoxaparin 0.5 mg/kg every 24 hours   [facility], PharmD, [facility] 05/23/2022 2:14 AM

## 2022-05-23 NOTE — H&P (Addendum)
[place]   PATIENT NAME: Emma Spears    MR#:  [id number]  DATE OF BIRTH:  Aug 04, 1955  DATE OF ADMISSION:  05/22/2022  PRIMARY CARE PHYSICIAN: [facility]   Patient is coming from: Home  REQUESTING/REFERRING PHYSICIAN: Ward, Layla MawKristen N, DO  CHIEF COMPLAINT:   Chief Complaint  Patient presents with   Shortness of Breath    HISTORY OF PRESENT ILLNESS:  Emma Spears is a 66 y.o. female with medical history significant for anxiety and depression, osteoarthritis and hypertension, who presented to the emergency room with acute onset of dyspnea with associated left-sided chest pain as well as dry cough with occasional wheezing.  She denies any fever or chills.  No nausea or vomiting or abdominal pain.  She denies any chest pain or palpitations.  She admits to mild headache without dizziness or blurred vision.  No rhinorrhea or nasal congestion or sore throat or earache.  She had a left total shoulder arthroplasty by Dr. Joice LoftsPoggi as a same-day surgery and was discharged yesterday.  She stated she did not have any respiratory symptoms prior to her surgery.  No dysuria, oliguria or hematuria or flank pain.  No bleeding diathesis.  Her left arm is in a sling.  ED Course: When the patient came to the ER pulse extremity was 96% on 2 L of O2 by nasal cannula vital signs were within normal.  The patient continued to require O2 and later on she was up to 6 L O2 by nasal cannula and pulse ox was 97% through simple mask.  Labs were remarkable for a lactic acid of 2.6 and later 4.7 high sensitive troponin I of 12 and later 10 CMP with a creatinine 1.47 and BUN of 33 up from previous levels and CBC showed leukocytosis 13.9 with neutrophilia.  Influenza antigens and COVID-19 PCR came back negative.  EKG as reviewed by me : EKG showed normal sinus rhythm with a rate of 87 with right bundle branch block and left anterior fascicular block with T wave inversion in V1 through  V3. Imaging: Portable chest ray showed shallow inspiration with atelectasis of the left lung base. Chest CTA revealed the following: 1. No evidence of arterial embolus. Slight prominence of the pulmonary trunk and main arteries indicating arterial hypertension. 2. Cardiomegaly with septal and left ventricular wall hypertrophy. No findings of acute CHF. 3. Aortic and coronary artery atherosclerosis. 4. COPD with low inspiration and left infrahilar consolidation extending over the posteromedial left hemidiaphragm which could be atelectasis or pneumonia. Follow-up study recommended to ensure clearing. 5. Lower lobe bronchitis without appreciable bronchial plugging. 6. Small hiatal hernia. 7. Recent reverse left shoulder arthroplasty with patchy, most likely postoperative soft tissue gas in the left axilla and left chest wall, and soft tissue stranding in the intermuscular planes of the anterior left chest and left upper arm. No focal fluid collection or hematoma. Clinical correlation and follow-up recommended. 8. Osteopenia, degenerative changes and moderate thoracic kyphosis. 9. Emphysema.  10.  Aortic Atherosclerosis.  The patient was given IV Rocephin and Zithromax.  She will be admitted to a medical telemetry bed for further evaluation and management. PAST MEDICAL HISTORY:   Past Medical History:  Diagnosis Date   Anxiety    Arthritis    Depression    Hypertension     PAST SURGICAL HISTORY:   Past Surgical History:  Procedure Laterality Date   ABDOMINAL HYSTERECTOMY N/A 1981   BACK SURGERY N/A  2 in 2001, 2003,  2 in 2005   herniated discs, implant repair work   CHOLECYSTECTOMY     COLONOSCOPY W/ POLYPECTOMY     GALLBLADDER SURGERY N/A 09/1975   JOINT REPLACEMENT Left 2018   total hip   SPINAL CORD STIMULATOR IMPLANT Right 2003   TOTAL HIP ARTHROPLASTY Left 05/28/2017   Procedure: TOTAL HIP ARTHROPLASTY ANTERIOR APPROACH;  Surgeon: Kennedy Bucker, MD;  Location: [facility]  ORS;  Service: Orthopedics;  Laterality: Left;   TOTAL HIP ARTHROPLASTY Right 09/03/2017   Procedure: TOTAL HIP ARTHROPLASTY ANTERIOR APPROACH;  Surgeon: Kennedy Bucker, MD;  Location: [facility] ORS;  Service: Orthopedics;  Laterality: Right;    SOCIAL HISTORY:   Social History   Tobacco Use   Smoking status: Former    Packs/day: 1.50    Years: 40.00    Total pack years: 60.00    Types: Cigarettes    Quit date: 07/23/2011    Years since quitting: 10.8   Smokeless tobacco: Never  Substance Use Topics   Alcohol use: Yes    Comment: rarely    FAMILY HISTORY:   Family History  Problem Relation Age of Onset   Breast cancer Sister 12   Liver cancer Paternal Aunt    Diabetes Mother    Hypertension Mother    Diabetes Father    Hypertension Father     DRUG ALLERGIES:   Allergies  Allergen Reactions   Vicodin [Hydrocodone-Acetaminophen] Nausea And Vomiting    REVIEW OF SYSTEMS:   ROS As per history of present illness. All pertinent systems were reviewed above. Constitutional, HEENT, cardiovascular, respiratory, GI, GU, musculoskeletal, neuro, psychiatric, endocrine, integumentary and hematologic systems were reviewed and are otherwise negative/unremarkable except for positive findings mentioned above in the HPI.   MEDICATIONS AT HOME:   Prior to Admission medications   Medication Sig Start Date End Date Taking? Authorizing Provider  escitalopram (LEXAPRO) 20 MG tablet Take 20 mg by mouth daily.   Yes [provider]  gabapentin (NEURONTIN) 300 MG capsule Take 300 mg by mouth at bedtime.  900mg  bedtime   Yes [provider]  hydrochlorothiazide (HYDRODIURIL) 25 MG tablet Take 25 mg by mouth daily. 03/27/22  Yes [provider]  hydrOXYzine (ATARAX) 25 MG tablet Take 25 mg by mouth daily as needed for anxiety. 03/04/22  Yes [provider]  meloxicam (MOBIC) 7.5 MG tablet Take 7.5 mg by mouth daily.   Yes [provider]  Multiple  Vitamin (MULTIVITAMIN WITH MINERALS) TABS tablet Take 1 tablet by mouth daily.   Yes [provider]  traZODone (DESYREL) 50 MG tablet Take 50 mg by mouth at bedtime.   Yes [provider]  acetaminophen (TYLENOL) 325 MG tablet Take 1-2 tablets (325-650 mg total) by mouth every 6 (six) hours as needed for mild pain (pain score 1-3 or temp > 100.5). 09/04/17   09/06/17, PA-C  docusate sodium (COLACE) 100 MG capsule Take 1 capsule (100 mg total) by mouth 2 (two) times daily as needed for mild constipation. 05/22/22   Poggi, 05/24/22, MD  loratadine (CLARITIN) 10 MG tablet Take 10 mg by mouth daily as needed for allergies.    [provider]  oxyCODONE (ROXICODONE) 5 MG immediate release tablet Take 1-2 tablets (5-10 mg total) by mouth every 4 (four) hours as needed for moderate pain or severe pain. 05/22/22   Poggi, 05/24/22, MD      VITAL SIGNS:  Blood pressure 105/66, pulse 80, temperature 97.8  F (36.6 C), temperature source Oral, resp. rate (!) 23, height  (1.6 m), weight 97.5 kg, SpO2 99 %.  PHYSICAL EXAMINATION:  Physical Exam  GENERAL:  66 y.o.-year-old patient lying in the bed with no acute distress.  EYES: Pupils equal, round, reactive to light and accommodation. No scleral icterus. Extraocular muscles intact.  HEENT: Head atraumatic, normocephalic. Oropharynx and nasopharynx clear.  NECK:  Supple, no jugular venous distention. No thyroid enlargement, no tenderness.  LUNGS: Diminished left basal breath sounds with left basal crackles.. No use of accessory muscles of respiration.  CARDIOVASCULAR: Regular rate and rhythm, S1, S2 normal. No murmurs, rubs, or gallops.  ABDOMEN: Soft, nondistended, nontender. Bowel sounds present. No organomegaly or mass.  EXTREMITIES: No pedal edema, cyanosis, or clubbing.  NEUROLOGIC: Cranial nerves II through XII are intact. Muscle strength 5/5 in all extremities. Sensation intact. Gait not checked.  PSYCHIATRIC: The  patient is alert and oriented x 3.  Normal affect and good eye contact. SKIN: No obvious rash, lesion, or ulcer.   LABORATORY PANEL:   CBC Recent Labs  Lab 05/22/22 2254  WBC 13.9*  HGB 12.2  HCT 36.0  PLT 232   ------------------------------------------------------------------------------------------------------------------  Chemistries  Recent Labs  Lab 05/22/22 2254  NA 140  K 3.9  CL 105  CO2 26  GLUCOSE 144*  BUN 33*  CREATININE 1.47*  CALCIUM 8.8*  AST 27  ALT 13  ALKPHOS 62  BILITOT 0.7   ------------------------------------------------------------------------------------------------------------------  Cardiac Enzymes No results for input(s): "TROPONINI" in the last 168 hours. ------------------------------------------------------------------------------------------------------------------  RADIOLOGY:  CT Angio Chest PE W and/or Wo Contrast  Result Date: 05/23/2022 CLINICAL DATA:  Left shoulder replacement yesterday, presents to the ED with shortness of breath, chest discomfort and coughing. Pulmonary embolism suspected. EXAM: CT ANGIOGRAPHY CHEST WITH CONTRAST TECHNIQUE: Multidetector CT imaging of the chest was performed using the standard protocol during bolus administration of intravenous contrast. Multiplanar CT image reconstructions and MIPs were obtained to evaluate the vascular anatomy. RADIATION DOSE REDUCTION: This exam was performed according to the departmental dose-optimization program which includes automated exposure control, adjustment of the mA and/or kV according to patient size and/or use of iterative reconstruction technique. CONTRAST:  75mL OMNIPAQUE IOHEXOL 350 MG/ML SOLN COMPARISON:  Portable chest yesterday, and portable chest and CT chest with contrast both 11/02/2007. FINDINGS: Cardiovascular: There is mild panchamber cardiomegaly with septal and left ventricular wall hypertrophy. No pericardial effusion is seen. Since 2009 there is  increased scattered three-vessel coronary artery calcification. There are increased calcifications in the aortic arch, interval increased aortic tortuosity but no aneurysm, dissection or stenosis. The great vessels are tortuous but appear patent. There is slight prominence of the pulmonary trunk and main arteries with the pulmonary trunk 3.2 cm indicating arterial hypertension, previously was 2.7 cm. No arterial embolism is seen. The pulmonary veins are normal caliber. Mediastinum/Nodes: No enlarged mediastinal, hilar, or axillary lymph nodes. Thyroid gland, trachea, and esophagus demonstrate no significant findings. There is a small hiatal hernia. The main bronchi are patent. Lungs/Pleura: There is no pleural effusion, thickening or pneumothorax. The lungs show mild-to-moderate centrilobular emphysematous change. There is a low inspiration on exam. Thickened bronchi noted both lower lobes without appreciable bronchial plugging. On the left, there is infrahilar consolidation extending over the posteromedial left hemidiaphragm which could be atelectasis or pneumonia and was not seen previously. There is no nodule or further evidence for pulmonary infiltrate. No subpleural fibrosis. There is mild posterior atelectasis in the lung bases. Upper Abdomen: Stable  postcholecystectomy common bile duct prominence with common bile duct 13 mm. Fatty atrophy in the visualized pancreas again also noted. Mild hepatic steatosis. No acute findings. Musculoskeletal: Osteopenia noted with degenerative changes of the spine and moderate thoracic kyphosis. The ribcage is intact. Spinal stimulator wiring in the dorsal spinal canal enters from inferiorly with the wiring terminating at T7. There is a reverse left shoulder arthroplasty newly noted. There is patchy soft tissue gas in the left axilla and left chest wall, soft tissue stranding is also seen in the intermuscular planes of the anterior left chest and scattered along the left upper  arm. There is no focal fluid collection or hematoma. There are overlying skin staples. Review of the MIP images confirms the above findings. IMPRESSION: 1. No evidence of arterial embolus. Slight prominence of the pulmonary trunk and main arteries indicating arterial hypertension. 2. Cardiomegaly with septal and left ventricular wall hypertrophy. No findings of acute CHF. 3. Aortic and coronary artery atherosclerosis. 4. COPD with low inspiration and left infrahilar consolidation extending over the posteromedial left hemidiaphragm which could be atelectasis or pneumonia. Follow-up study recommended to ensure clearing. 5. Lower lobe bronchitis without appreciable bronchial plugging. 6. Small hiatal hernia. 7. Recent reverse left shoulder arthroplasty with patchy, most likely postoperative soft tissue gas in the left axilla and left chest wall, and soft tissue stranding in the intermuscular planes of the anterior left chest and left upper arm. No focal fluid collection or hematoma. Clinical correlation and follow-up recommended. 8. Osteopenia, degenerative changes and moderate thoracic kyphosis. 9. Emphysema. Aortic Atherosclerosis (ICD10-I70.0) and Emphysema (ICD10-J43.9). Electronically Signed   By: Almira Bar M.D.   On: 05/23/2022 00:35   DG Chest Portable 1 View  Result Date: 05/22/2022 CLINICAL DATA:  Shortness of breath. Left shoulder replacement today. EXAM: PORTABLE CHEST 1 VIEW COMPARISON:  11/02/2007 FINDINGS: Shallow inspiration with atelectasis in the left lung base. Heart size and pulmonary vascularity are normal for technique. No pleural effusion or pneumothorax. Calcified and tortuous aorta. Postoperative changes with reverse left shoulder arthroplasty. Stimulator lead tips projected over the midthoracic spine. IMPRESSION: Shallow inspiration with atelectasis in the left base. Electronically Signed   By: Burman Nieves M.D.   On: 05/22/2022 23:37   DG Shoulder Left Port  Result Date:  05/22/2022 CLINICAL DATA:  Status post reversed left shoulder arthroplasty. EXAM: LEFT SHOULDER COMPARISON:  CT left shoulder 03/21/2022 FINDINGS: Interval reverse left shoulder arthroplasty. No perihardware lucency is seen to indicate hardware failure or loosening. Normal alignment. Expected postoperative air within the superior shoulder. Anterior surgical skin staples. Normal alignment of the acromioclavicular joint. No acute fracture or dislocation. Partial visualization of lower thoracic spine spinal cord stimulator. IMPRESSION: Interval reverse left shoulder arthroplasty without evidence of hardware failure. Electronically Signed   By: Neita Garnet M.D.   On: 05/22/2022 14:33   [place] OR NERVE BLOCK-IMAGE ONLY [facility])  Result Date: 05/22/2022 There is no interpretation for this exam.  This order is for images obtained during a surgical procedure.  Please See "Surgeries" Tab for more information regarding the procedure.      IMPRESSION AND PLAN:  Assessment and Plan: * Aspiration pneumonia (HCC) - I Suspect left lower lobe pneumonia less likely aspiration given her recent anesthesia. - The patient has subsequent acute respiratory failure with hypoxia. - The patient will be admitted to a medical telemetry bed. - We will continue IV Rocephin and Zithromax. - Given the high likelihood of aspiration IV Flagyl was added. - Mucolytic therapy and  bronchodilator therapy will be provided. - We will follow blood cultures.  Sepsis due to pneumonia [facility]) - This medical by tachycardia with heart rate was 99 while she was in the ER, tachypnea that was up to 23 and later 31 in addition to leukocytosis. - We will hydrate with IV normal saline. - Management otherwise as above.  Essential hypertension - We will hold off at Td given acute kidney injury and placed on as needed IV labetalol.  Major depression - We will continue her Lexapro.  AKI (acute kidney injury) (HCC) - The patient will be hydrated  with IV normal saline and we  will follow-up BMP. - We will avoid nephrotoxins.    DVT prophylaxis: Lovenox.  Advanced Care Planning:  Code Status: full code.  Family Communication:  The plan of care was discussed in details with the patient (and family). I answered all questions. The patient agreed to proceed with the above mentioned plan. Further management will depend upon hospital course. Disposition Plan: Back to previous home environment Consults called: none.  All the records are reviewed and case discussed with ED provider.  Status is: Inpatient   At the time of the admission, it appears that the appropriate admission status for this patient is inpatient.  This is judged to be reasonable and necessary in order to provide the required intensity of service to ensure the patient's safety given the presenting symptoms, physical exam findings and initial radiographic and laboratory data in the context of comorbid conditions.  The patient requires inpatient status due to high intensity of service, high risk of further deterioration and high frequency of surveillance required.  I certify that at the time of admission, it is my clinical judgment that the patient will require inpatient hospital care extending more than 2 midnights.                            Dispo: The patient is from: Home              Anticipated d/c is to: Home              Patient currently is not medically stable to d/c.              Difficult to place patient: No  Hannah Beat M.D on 05/23/2022 at 4:39 AM  [facility]   From 7 PM-7 AM, contact night-coverage [email or web address]  CC: Primary care physician; [facility]

## 2022-05-23 NOTE — Care Management Obs Status (Signed)
MEDICARE OBSERVATION STATUS NOTIFICATION   Patient Details  Name: Emma Spears MRN: [id number] Date of Birth: 02/19/1956   Medicare Observation Status Notification Given:  Yes    Allayne Butcher, RN 05/23/2022, 3:03 PM

## 2022-05-23 NOTE — ED Notes (Signed)
Pt eating meal at this time

## 2022-05-23 NOTE — Assessment & Plan Note (Signed)
-   This medical by tachycardia with heart rate was 99 while she was in the ER, tachypnea that was up to 23 and later 31 in addition to leukocytosis. - We will hydrate with IV normal saline. - Management otherwise as above.

## 2022-05-23 NOTE — Assessment & Plan Note (Signed)
-   We will continue her Lexapro. 

## 2022-05-23 NOTE — ED Notes (Signed)
Pt requesting to turn on spinal stimulator. RN states she will consult w Dr.

## 2022-05-23 NOTE — ED Notes (Signed)
Pt discharge to home. Pt VSS, GCS 15, NAD. Pt verbalized understanding of discharge instructions with no additional questions at this time.  

## 2022-05-23 NOTE — ED Notes (Signed)
MD Allena Katz states okay for patient to turn on spinal stimulator.

## 2022-05-23 NOTE — Assessment & Plan Note (Signed)
-   The patient will be hydrated with IV normal saline and we  will follow-up BMP. - We will avoid nephrotoxins.

## 2022-05-23 NOTE — Assessment & Plan Note (Addendum)
-   I Suspect left lower lobe pneumonia less likely aspiration given her recent anesthesia. - The patient has subsequent acute respiratory failure with hypoxia. - The patient will be admitted to a medical telemetry bed. - We will continue IV Rocephin and Zithromax. - Given the high likelihood of aspiration IV Flagyl was added. - Mucolytic therapy and bronchodilator therapy will be provided. - We will follow blood cultures.

## 2022-05-26 LAB — SURGICAL PATHOLOGY

## 2022-05-27 ENCOUNTER — Encounter: Payer: Self-pay | Admitting: Surgery

## 2022-05-28 LAB — CULTURE, BLOOD (ROUTINE X 2)
Culture: NO GROWTH
Culture: NO GROWTH

## 2022-08-26 ENCOUNTER — Other Ambulatory Visit: Payer: Self-pay | Admitting: Surgery

## 2022-08-26 DIAGNOSIS — M19011 Primary osteoarthritis, right shoulder: Secondary | ICD-10-CM

## 2022-08-28 ENCOUNTER — Telehealth: Payer: Self-pay

## 2022-08-28 ENCOUNTER — Other Ambulatory Visit: Payer: Self-pay

## 2022-08-28 DIAGNOSIS — Z87891 Personal history of nicotine dependence: Secondary | ICD-10-CM

## 2022-08-28 DIAGNOSIS — Z122 Encounter for screening for malignant neoplasm of respiratory organs: Secondary | ICD-10-CM

## 2022-08-28 NOTE — Telephone Encounter (Signed)
Received VM from patient requesting a call back.  Called but no answer. Left VM

## 2022-09-03 ENCOUNTER — Ambulatory Visit
Admission: RE | Admit: 2022-09-03 | Discharge: 2022-09-03 | Disposition: A | Discharge status: Home / Self Care | Payer: Medicare HMO | Source: Ambulatory Visit | Attending: Surgery | Admitting: Surgery

## 2022-09-03 DIAGNOSIS — M19011 Primary osteoarthritis, right shoulder: Secondary | ICD-10-CM | POA: Insufficient documentation

## 2022-09-17 ENCOUNTER — Other Ambulatory Visit: Payer: Self-pay | Admitting: Surgery

## 2022-09-23 ENCOUNTER — Encounter
Admission: RE | Admit: 2022-09-23 | Discharge: 2022-09-23 | Disposition: A | Discharge status: Home / Self Care | Payer: Medicare HMO | Source: Ambulatory Visit | Attending: Surgery | Admitting: Surgery

## 2022-09-23 ENCOUNTER — Other Ambulatory Visit: Payer: Self-pay

## 2022-09-23 VITALS — BP 117/91 | HR 78 | Resp 16 | Ht 63.0 in | Wt 220.0 lb

## 2022-09-23 DIAGNOSIS — R8290 Unspecified abnormal findings in urine: Secondary | ICD-10-CM | POA: Diagnosis not present

## 2022-09-23 DIAGNOSIS — Z01812 Encounter for preprocedural laboratory examination: Secondary | ICD-10-CM

## 2022-09-23 DIAGNOSIS — Z01818 Encounter for other preprocedural examination: Secondary | ICD-10-CM | POA: Diagnosis present

## 2022-09-23 HISTORY — DX: Pneumonia, unspecified organism: J18.9

## 2022-09-23 HISTORY — DX: Chronic obstructive pulmonary disease, unspecified: J44.9

## 2022-09-23 LAB — URINALYSIS, ROUTINE W REFLEX MICROSCOPIC
Bilirubin Urine: NEGATIVE
Glucose, UA: NEGATIVE mg/dL
Hgb urine dipstick: NEGATIVE
Ketones, ur: NEGATIVE mg/dL
Nitrite: NEGATIVE
Protein, ur: 30 mg/dL — AB
Specific Gravity, Urine: 1.027 (ref 1.005–1.030)
pH: 5 (ref 5.0–8.0)

## 2022-09-23 LAB — CBC WITH DIFFERENTIAL/PLATELET
Abs Immature Granulocytes: 0.03 10*3/uL (ref 0.00–0.07)
Basophils Absolute: 0.1 10*3/uL (ref 0.0–0.1)
Basophils Relative: 1 %
Eosinophils Absolute: 0.1 10*3/uL (ref 0.0–0.5)
Eosinophils Relative: 1 %
HCT: 42.6 % (ref 36.0–46.0)
Hemoglobin: 14.6 g/dL (ref 12.0–15.0)
Immature Granulocytes: 0 %
Lymphocytes Relative: 24 %
Lymphs Abs: 1.9 10*3/uL (ref 0.7–4.0)
MCH: 31.1 pg (ref 26.0–34.0)
MCHC: 34.3 g/dL (ref 30.0–36.0)
MCV: 90.6 fL (ref 80.0–100.0)
Monocytes Absolute: 0.5 10*3/uL (ref 0.1–1.0)
Monocytes Relative: 6 %
Neutro Abs: 5.6 10*3/uL (ref 1.7–7.7)
Neutrophils Relative %: 68 %
Platelets: 250 10*3/uL (ref 150–400)
RBC: 4.7 MIL/uL (ref 3.87–5.11)
RDW: 12.6 % (ref 11.5–15.5)
WBC: 8.2 10*3/uL (ref 4.0–10.5)
nRBC: 0 % (ref 0.0–0.2)

## 2022-09-23 LAB — COMPREHENSIVE METABOLIC PANEL
ALT: 14 U/L (ref 0–44)
AST: 25 U/L (ref 15–41)
Albumin: 4.5 g/dL (ref 3.5–5.0)
Alkaline Phosphatase: 69 U/L (ref 38–126)
Anion gap: 8 (ref 5–15)
BUN: 33 mg/dL — ABNORMAL HIGH (ref 8–23)
CO2: 27 mmol/L (ref 22–32)
Calcium: 9.5 mg/dL (ref 8.9–10.3)
Chloride: 103 mmol/L (ref 98–111)
Creatinine, Ser: 1.3 mg/dL — ABNORMAL HIGH (ref 0.44–1.00)
GFR, Estimated: 45 mL/min — ABNORMAL LOW (ref >60)
Glucose, Bld: 120 mg/dL — ABNORMAL HIGH (ref 70–99)
Potassium: 3.8 mmol/L (ref 3.5–5.1)
Sodium: 138 mmol/L (ref 135–145)
Total Bilirubin: 0.9 mg/dL (ref 0.3–1.2)
Total Protein: 7.9 g/dL (ref 6.5–8.1)

## 2022-09-23 LAB — TYPE AND SCREEN
ABO/RH(D): O POS
Antibody Screen: NEGATIVE

## 2022-09-23 LAB — SURGICAL PCR SCREEN
MRSA, PCR: NEGATIVE
Staphylococcus aureus: NEGATIVE

## 2022-09-23 NOTE — Patient Instructions (Addendum)
Your procedure is scheduled on: Thursday October 02, 2022. Report to the Registration Desk on the 1st floor of the [facility]. To find out your arrival time, please call [phone] between 1PM - 3PM on: Wednesday October 01, 2022. If your arrival time is 6:00 am, do not arrive before that time as the [facility] entrance doors do not open until 6:00 am.  REMEMBER: Instructions that are not followed completely may result in serious medical risk, up to and including death; or upon the discretion of your surgeon and anesthesiologist your surgery may need to be rescheduled.  Do not eat food after midnight the night before surgery.  No gum chewing or hard candies.  You may however, drink CLEAR liquids up to 2 hours before you are scheduled to arrive for your surgery. Do not drink anything within 2 hours of your scheduled arrival time.  Clear liquids include: - water  - apple juice without pulp - gatorade (not RED colors) - black coffee or tea (Do NOT add milk or creamers to the coffee or tea) Do NOT drink anything that is not on this list.   In addition, your doctor has ordered for you to drink the provided:  Ensure Pre-Surgery Clear Carbohydrate Drink  Gatorade G2 Drinking this carbohydrate drink up to two hours before surgery helps to reduce insulin resistance and improve patient outcomes. Please complete drinking 2 hours before scheduled arrival time.  One week prior to surgery: Stop Anti-inflammatories (NSAIDS) such as Advil, Aleve, Ibuprofen, Motrin, Naproxen, Naprosyn, meloxicam,and Aspirin based products such as Excedrin, Goody's Powder, BC Powder. You may however, continue to take Tylenol if needed for pain up until the day of surgery. Stop ALL OVER THE COUNTER supplements until after surgery. Multiple Vitamin (MULTIVITAMIN WITH MINERALS    Continue taking all prescribed medications with the exception of the following:   Follow recommendations from Cardiologist or PCP  regarding stopping blood thinners.  TAKE ONLY THESE MEDICATIONS THE MORNING OF SURGERY WITH A SIP OF WATER:  escitalopram (LEXAPRO) 20 MG  hydrOXYzine (ATARAX) 25 MG (if needed)   No Alcohol for 24 hours before or after surgery.  No Smoking including e-cigarettes for 24 hours before surgery.  No chewable tobacco products for at least 6 hours before surgery.  No nicotine patches on the day of surgery.  Do not use any "recreational" drugs for at least a week (preferably 2 weeks) before your surgery.  Please be advised that the combination of cocaine and anesthesia may have negative outcomes, up to and including death. If you test positive for cocaine, your surgery will be cancelled.  On the morning of surgery brush your teeth with toothpaste and water, you may rinse your mouth with mouthwash if you wish. Do not swallow any toothpaste or mouthwash.  Use CHG Soap or wipes as directed on instruction sheet.  Do not wear jewelry, make-up, hairpins, clips or nail polish.  Do not wear lotions, powders, or perfumes.   Do not shave body hair from the neck down 48 hours before surgery.  Contact lenses, hearing aids and dentures may not be worn into surgery.  Do not bring valuables to the hospital. [facility] is not responsible for any missing/lost belongings or valuables.   Total Shoulder Arthroplasty:  use Benzoyl Peroxide 5% Gel as directed on instruction sheet.  Bring your C-PAP to the hospital in case you may have to spend the night.   Notify your doctor if there is any change in your medical  condition (cold, fever, infection).  Wear comfortable clothing (specific to your surgery type) to the hospital.  After surgery, you can help prevent lung complications by doing breathing exercises.  Take deep breaths and cough every 1-2 hours. Your doctor may order a device called an Incentive Spirometer to help you take deep breaths. When coughing or sneezing, hold a pillow firmly against  your incision with both hands. This is called "splinting." Doing this helps protect your incision. It also decreases belly discomfort.  If you are being admitted to the hospital overnight, leave your suitcase in the car. After surgery it may be brought to your room.  In case of increased patient census, it may be necessary for you, the patient, to continue your postoperative care in the Same Day Surgery department.  If you are being discharged the day of surgery, you will not be allowed to drive home. You will need a responsible individual to drive you home and stay with you for 24 hours after surgery.   If you are taking public transportation, you will need to have a responsible individual with you.  Please call the Pre-admissions Testing Dept. at [phone] if you have any questions about these instructions.  Surgery Visitation Policy:  Patients having surgery or a procedure may have two visitors.  Children under the age of 71 must have an adult with them who is not the patient.  Inpatient Visitation:    Visiting hours are 7 a.m. to 8 p.m. Up to four visitors are allowed at one time in a patient room. The visitors may rotate out with other people during the day.  One visitor age 76 or older may stay with the patient overnight and must be in the room by 8 p.m.  How to Use an Incentive Spirometer  An incentive spirometer is a tool that measures how well you are filling your lungs with each breath. Learning to take long, deep breaths using this tool can help you keep your lungs clear and active. This may help to reverse or lessen your chance of developing breathing (pulmonary) problems, especially infection. You may be asked to use a spirometer: After a surgery. If you have a lung problem or a history of smoking. After a long period of time when you have been unable to move or be active. If the spirometer includes an indicator to show the highest number that you have reached, your  health care provider or respiratory therapist will help you set a goal. Keep a log of your progress as told by your health care provider. What are the risks? Breathing too quickly may cause dizziness or cause you to pass out. Take your time so you do not get dizzy or light-headed. If you are in pain, you may need to take pain medicine before doing incentive spirometry. It is harder to take a deep breath if you are having pain. How to use your incentive spirometer  Sit up on the edge of your bed or on a chair. Hold the incentive spirometer so that it is in an upright position. Before you use the spirometer, breathe out normally. Place the mouthpiece in your mouth. Make sure your lips are closed tightly around it. Breathe in slowly and as deeply as you can through your mouth, causing the piston or the ball to rise toward the top of the chamber. Hold your breath for 3-5 seconds, or for as long as possible. If the spirometer includes a coach indicator, use this to guide  you in breathing. Slow down your breathing if the indicator goes above the marked areas. Remove the mouthpiece from your mouth and breathe out normally. The piston or ball will return to the bottom of the chamber. Rest for a few seconds, then repeat the steps 10 or more times. Take your time and take a few normal breaths between deep breaths so that you do not get dizzy or light-headed. Do this every 1-2 hours when you are awake. If the spirometer includes a goal marker to show the highest number you have reached (best effort), use this as a goal to work toward during each repetition. After each set of 10 deep breaths, cough a few times. This will help to make sure that your lungs are clear. If you have an incision on your chest or abdomen from surgery, place a pillow or a rolled-up towel firmly against the incision when you cough. This can help to reduce pain while taking deep breaths and coughing. General tips When you are able to  get out of bed: Walk around often. Continue to take deep breaths and cough in order to clear your lungs. Keep using the incentive spirometer until your health care provider says it is okay to stop using it. If you have been in the hospital, you may be told to keep using the spirometer at home. Contact a health care provider if: You are having difficulty using the spirometer. You have trouble using the spirometer as often as instructed. Your pain medicine is not giving enough relief for you to use the spirometer as told. You have a fever. Get help right away if: You develop shortness of breath. You develop a cough with bloody mucus from the lungs. You have fluid or blood coming from an incision site after you cough. Summary An incentive spirometer is a tool that can help you learn to take long, deep breaths to keep your lungs clear and active. You may be asked to use a spirometer after a surgery, if you have a lung problem or a history of smoking, or if you have been inactive for a long period of time. Use your incentive spirometer as instructed every 1-2 hours while you are awake. If you have an incision on your chest or abdomen, place a pillow or a rolled-up towel firmly against your incision when you cough. This will help to reduce pain. Get help right away if you have shortness of breath, you cough up bloody mucus, or blood comes from your incision when you cough. This information is not intended to replace advice given to you by your health care provider. Make sure you discuss any questions you have with your health care provider. Document Revised: 08/15/2019 Document Reviewed: 08/15/2019 Elsevier Patient Education  2023 [employer]   Preparing for Total Shoulder Arthroplasty  Before surgery, you can play an important role by reducing the number of germs on your skin by using the following products:  Benzoyl Peroxide Gel  o Reduces the number of germs present on the skin  o  Applied twice a day to shoulder area starting two days before surgery  Chlorhexidine Gluconate (CHG) Soap  o An antiseptic cleaner that kills germs and bonds with the skin to continue killing germs even after washing  o Used for showering the night before surgery and morning of surgery  BENZOYL PEROXIDE 5% GEL  Please do not use if you have an allergy to benzoyl peroxide. If your skin becomes reddened/irritated stop using the benzoyl  peroxide.  Starting two days before surgery, apply as follows:  1. Apply benzoyl peroxide in the morning and at night. Apply after taking a shower. If you are not taking a shower, clean entire shoulder front, back, and side along with the armpit with a clean wet washcloth.  2. Place a quarter-sized dollop on your shoulder and rub in thoroughly, making sure to cover the front, back, and side of your shoulder, along with the armpit.  (09/30/22)2 days before ____ AM ____ PM  (10/01/22) 1 day before ____ AM ____ PM   3. Do this twice a day for two days. (Last application is the night before surgery, AFTER using the CHG soap).  4. Do NOT apply benzoyl peroxide gel on the day of surgery.

## 2022-09-24 LAB — URINE CULTURE: Culture: NO GROWTH

## 2022-09-30 ENCOUNTER — Ambulatory Visit (INDEPENDENT_AMBULATORY_CARE_PROVIDER_SITE_OTHER): Payer: Medicare HMO | Admitting: Acute Care

## 2022-09-30 ENCOUNTER — Encounter: Payer: Self-pay | Admitting: Acute Care

## 2022-09-30 DIAGNOSIS — Z87891 Personal history of nicotine dependence: Secondary | ICD-10-CM | POA: Diagnosis not present

## 2022-09-30 DIAGNOSIS — F17211 Nicotine dependence, cigarettes, in remission: Secondary | ICD-10-CM | POA: Diagnosis not present

## 2022-09-30 NOTE — Progress Notes (Signed)
Virtual Visit via Telephone Note  I connected with Emma Spears on 09/30/22 at  1:30 PM EDT by telephone and verified that I am speaking with the correct person using two identifiers.  Location: Patient:  At home Provider: [address], [place], [place], [address]    I discussed the limitations, risks, security and privacy concerns of performing an evaluation and management service by telephone and the availability of in person appointments. I also discussed with the patient that there may be a patient responsible charge related to this service. The patient expressed understanding and agreed to proceed.     Shared Decision Making Visit Lung Cancer Screening Program [phone])   Eligibility: Age 67 y.o. Pack Years Smoking History Calculation 45 pack year smoking history (# packs/per year x # years smoked) Recent History of coughing up blood  no Unexplained weight loss? no ( >Than 15 pounds within the last 6 months ) Prior History Lung / other cancer no (Diagnosis within the last 5 years already requiring surveillance chest CT Scans). Smoking Status Former Smoker Former Smokers: Years since quit: 11 years  Quit Date: 2013  Visit Components: Discussion included one or more decision making aids. yes Discussion included risk/benefits of screening. yes Discussion included potential follow up diagnostic testing for abnormal scans. yes Discussion included meaning and risk of over diagnosis. yes Discussion included meaning and risk of False Positives. yes Discussion included meaning of total radiation exposure. yes  Counseling Included: Importance of adherence to annual lung cancer LDCT screening. yes Impact of comorbidities on ability to participate in the program. yes Ability and willingness to under diagnostic treatment. yes  Smoking Cessation Counseling: Current Smokers:  Discussed importance of smoking cessation. yes Information about tobacco cessation classes and  interventions provided to patient. yes Patient provided with "ticket" for LDCT Scan. yes Symptomatic Patient. no  Counseling NA Diagnosis Code: Tobacco Use Z72.0 Asymptomatic Patient yes  Counseling (Intermediate counseling: > three minutes counseling) J8119 Former Smokers:  Discussed the importance of maintaining cigarette abstinence. yes Diagnosis Code: Personal History of Nicotine Dependence. J47.829 Information about tobacco cessation classes and interventions provided to patient. Yes Patient provided with "ticket" for LDCT Scan. yes Written Order for Lung Cancer Screening with LDCT placed in Epic. Yes (CT Chest Lung Cancer Screening Low Dose W/O CM) [id number] Z12.2-Screening of respiratory organs Z87.891-Personal history of nicotine dependence  I spent 25 minutes of face to face time/virtual visit time  with  Emma Spears discussing the risks and benefits of lung cancer screening. We took the time to pause the power point at intervals to allow for questions to be asked and answered to ensure understanding. We discussed that she had taken the single most powerful action possible to decrease her risk of developing lung cancer when she quit smoking. I counseled her to remain smoke free, and to contact me if she ever had the desire to smoke again so that I can provide resources and tools to help support the effort to remain smoke free. We discussed the time and location of the scan, and that either  Abigail Miyamoto RN, Karlton Lemon, RN or I  or I will call / send a letter with the results within  24-72 hours of receiving them. She has the office contact information in the event she needs to speak with me,  she verbalized understanding of all of the above and had no further questions upon leaving the office.     I explained to the patient that  there has been a high incidence of coronary artery disease noted on these exams. I explained that this is a non-gated exam therefore degree or severity cannot  be determined. This patient is not on statin therapy. I have asked the patient to follow-up with their PCP regarding any incidental finding of coronary artery disease and management with diet or medication as they feel is clinically indicated. The patient verbalized understanding of the above and had no further questions.     Bevelyn Ngo, NP 09/30/2022

## 2022-09-30 NOTE — Patient Instructions (Signed)
Thank you for participating in the [facility]. It was our pleasure to meet you today. We will call you with the results of your scan within the next few days. Your scan will be assigned a Lung RADS category score by the physicians reading the scans.  This Lung RADS score determines follow up scanning.  See below for description of categories, and follow up screening recommendations. We will be in touch to schedule your follow up screening annually or based on recommendations of our providers. We will fax a copy of your scan results to your Primary Care Physician, or the physician who referred you to the program, to ensure they have the results. Please call the office if you have any questions or concerns regarding your scanning experience or results.  Our office number is [phone]. Please speak with Denise Phelps, RN. , or  Denise Buckner RN, They are  our Lung Cancer Screening RN.'s If They are unavailable when you call, Please leave a message on the voice mail. We will return your call at our earliest convenience.This voice mail is monitored several times a day.  Remember, if your scan is normal, we will scan you annually as long as you continue to meet the criteria for the program. (Age 50-80, Current smoker or smoker who has quit within the last 15 years). If you are a smoker, remember, quitting is the single most powerful action that you can take to decrease your risk of lung cancer and other pulmonary, breathing related problems. We know quitting is hard, and we are here to help.  Please let us know if there is anything we can do to help you meet your goal of quitting. If you are a former smoker, congratulations. We are proud of you! Remain smoke free! Remember you can refer friends or family members through the number above.  We will screen them to make sure they meet criteria for the program. Thank you for helping us take better care of you by  participating in Lung Screening.  You can receive free nicotine replacement therapy ( patches, gum or mints) by calling 1-800-QUIT NOW. Please call so we can get you on the path to becoming  a non-smoker. I know it is hard, but you can do this!  Lung RADS Categories:  Lung RADS 1: no nodules or definitely non-concerning nodules.  Recommendation is for a repeat annual scan in 12 months.  Lung RADS 2:  nodules that are non-concerning in appearance and behavior with a very low likelihood of becoming an active cancer. Recommendation is for a repeat annual scan in 12 months.  Lung RADS 3: nodules that are probably non-concerning , includes nodules with a low likelihood of becoming an active cancer.  Recommendation is for a 6-month repeat screening scan. Often noted after an upper respiratory illness. We will be in touch to make sure you have no questions, and to schedule your 6-month scan.  Lung RADS 4 A: nodules with concerning findings, recommendation is most often for a follow up scan in 3 months or additional testing based on our provider's assessment of the scan. We will be in touch to make sure you have no questions and to schedule the recommended 3 month follow up scan.  Lung RADS 4 B:  indicates findings that are concerning. We will be in touch with you to schedule additional diagnostic testing based on our provider's  assessment of the scan.  Other options for assistance in smoking cessation (   As covered by your insurance benefits)  Hypnosis for smoking cessation  [employer] [phone]  Acupuncture for smoking cessation  [facility] [phone]   

## 2022-10-01 ENCOUNTER — Ambulatory Visit
Admission: RE | Admit: 2022-10-01 | Discharge: 2022-10-01 | Disposition: A | Discharge status: Home / Self Care | Payer: Medicare HMO | Source: Ambulatory Visit | Attending: Acute Care | Admitting: Acute Care

## 2022-10-01 DIAGNOSIS — I2510 Atherosclerotic heart disease of native coronary artery without angina pectoris: Secondary | ICD-10-CM | POA: Insufficient documentation

## 2022-10-01 DIAGNOSIS — I700 Atherosclerosis of aorta: Secondary | ICD-10-CM | POA: Insufficient documentation

## 2022-10-01 DIAGNOSIS — J439 Emphysema, unspecified: Secondary | ICD-10-CM | POA: Insufficient documentation

## 2022-10-01 DIAGNOSIS — Z122 Encounter for screening for malignant neoplasm of respiratory organs: Secondary | ICD-10-CM | POA: Insufficient documentation

## 2022-10-01 DIAGNOSIS — Z87891 Personal history of nicotine dependence: Secondary | ICD-10-CM | POA: Diagnosis present

## 2022-10-01 MED ORDER — ORAL CARE MOUTH RINSE: 15.0000 mL | Freq: Once | OROMUCOSAL | Status: AC

## 2022-10-01 MED ORDER — CHLORHEXIDINE GLUCONATE 0.12 % MT SOLN
15.0000 mL | Freq: Once | OROMUCOSAL | Status: AC
  Administered 2022-10-02: 15 mL via OROMUCOSAL

## 2022-10-01 MED ORDER — CEFAZOLIN SODIUM-DEXTROSE 2-4 GM/100ML-% IV SOLN
2.0000 g | INTRAVENOUS | Status: AC
  Administered 2022-10-02: 2 g via INTRAVENOUS

## 2022-10-01 MED ORDER — FAMOTIDINE 20 MG PO TABS
20.0000 mg | ORAL_TABLET | Freq: Once | ORAL | Status: AC
  Administered 2022-10-02: 20 mg via ORAL

## 2022-10-01 MED ORDER — LACTATED RINGERS IV SOLN: INTRAVENOUS | Status: DC

## 2022-10-02 ENCOUNTER — Encounter: Payer: Self-pay | Admitting: Surgery

## 2022-10-02 ENCOUNTER — Encounter
Admission: RE | Disposition: A | Discharge status: Home / Self Care | Payer: Self-pay | Source: Home / Self Care | Attending: Surgery

## 2022-10-02 ENCOUNTER — Ambulatory Visit: Payer: Medicare HMO

## 2022-10-02 ENCOUNTER — Ambulatory Visit
Admission: RE | Admit: 2022-10-02 | Discharge: 2022-10-02 | Disposition: A | Discharge status: Home / Self Care | Payer: Medicare HMO | Attending: Surgery | Admitting: Surgery

## 2022-10-02 ENCOUNTER — Other Ambulatory Visit: Payer: Self-pay

## 2022-10-02 ENCOUNTER — Ambulatory Visit: Payer: Medicare HMO | Admitting: Urgent Care

## 2022-10-02 DIAGNOSIS — M19011 Primary osteoarthritis, right shoulder: Secondary | ICD-10-CM | POA: Insufficient documentation

## 2022-10-02 DIAGNOSIS — F419 Anxiety disorder, unspecified: Secondary | ICD-10-CM | POA: Diagnosis not present

## 2022-10-02 DIAGNOSIS — Z87891 Personal history of nicotine dependence: Secondary | ICD-10-CM | POA: Diagnosis not present

## 2022-10-02 DIAGNOSIS — R8290 Unspecified abnormal findings in urine: Secondary | ICD-10-CM

## 2022-10-02 DIAGNOSIS — Z01812 Encounter for preprocedural laboratory examination: Secondary | ICD-10-CM

## 2022-10-02 DIAGNOSIS — F32A Depression, unspecified: Secondary | ICD-10-CM | POA: Insufficient documentation

## 2022-10-02 DIAGNOSIS — Z09 Encounter for follow-up examination after completed treatment for conditions other than malignant neoplasm: Secondary | ICD-10-CM | POA: Insufficient documentation

## 2022-10-02 DIAGNOSIS — I10 Essential (primary) hypertension: Secondary | ICD-10-CM | POA: Insufficient documentation

## 2022-10-02 DIAGNOSIS — R8271 Bacteriuria: Secondary | ICD-10-CM

## 2022-10-02 DIAGNOSIS — M1990 Unspecified osteoarthritis, unspecified site: Secondary | ICD-10-CM | POA: Insufficient documentation

## 2022-10-02 DIAGNOSIS — R350 Frequency of micturition: Secondary | ICD-10-CM

## 2022-10-02 HISTORY — PX: REVERSE SHOULDER ARTHROPLASTY: SHX5054

## 2022-10-02 SURGERY — ARTHROPLASTY, SHOULDER, TOTAL, REVERSE
Anesthesia: General | Site: Shoulder | Laterality: Right

## 2022-10-02 MED ORDER — FAMOTIDINE 20 MG PO TABS
ORAL_TABLET | ORAL | Status: AC
  Filled 2022-10-02: qty 1

## 2022-10-02 MED ORDER — BUPIVACAINE HCL (PF) 0.5 % IJ SOLN
INTRAMUSCULAR | Status: AC
  Filled 2022-10-02: qty 10

## 2022-10-02 MED ORDER — PROPOFOL 10 MG/ML IV BOLUS
INTRAVENOUS | Status: AC
  Filled 2022-10-02: qty 20

## 2022-10-02 MED ORDER — ONDANSETRON HCL 4 MG/2ML IJ SOLN: 4.0000 mg | Freq: Four times a day (QID) | INTRAMUSCULAR | Status: DC | PRN

## 2022-10-02 MED ORDER — CEFAZOLIN SODIUM-DEXTROSE 2-4 GM/100ML-% IV SOLN: 2.0000 g | Freq: Four times a day (QID) | INTRAVENOUS | Status: DC

## 2022-10-02 MED ORDER — OXYCODONE HCL 5 MG PO TABS: 5.0000 mg | ORAL_TABLET | Freq: Once | ORAL | Status: DC | PRN

## 2022-10-02 MED ORDER — EPHEDRINE 5 MG/ML INJ
INTRAVENOUS | Status: AC
  Filled 2022-10-02: qty 5

## 2022-10-02 MED ORDER — LIDOCAINE HCL (PF) 2 % IJ SOLN
INTRAMUSCULAR | Status: AC
  Filled 2022-10-02: qty 5

## 2022-10-02 MED ORDER — CHLORHEXIDINE GLUCONATE 0.12 % MT SOLN
OROMUCOSAL | Status: AC
  Filled 2022-10-02: qty 15

## 2022-10-02 MED ORDER — METOCLOPRAMIDE HCL 10 MG PO TABS: 5.0000 mg | ORAL_TABLET | Freq: Three times a day (TID) | ORAL | Status: DC | PRN

## 2022-10-02 MED ORDER — TRANEXAMIC ACID 1000 MG/10ML IV SOLN
INTRAVENOUS | Status: DC | PRN
  Administered 2022-10-02: 1000 mg via TOPICAL

## 2022-10-02 MED ORDER — FENTANYL CITRATE (PF) 100 MCG/2ML IJ SOLN: 25.0000 ug | INTRAMUSCULAR | Status: DC | PRN

## 2022-10-02 MED ORDER — PROPOFOL 10 MG/ML IV BOLUS
INTRAVENOUS | Status: DC | PRN
  Administered 2022-10-02: 130 mg via INTRAVENOUS

## 2022-10-02 MED ORDER — KETOROLAC TROMETHAMINE 15 MG/ML IJ SOLN
15.0000 mg | Freq: Once | INTRAMUSCULAR | Status: AC
  Administered 2022-10-02: 15 mg via INTRAVENOUS

## 2022-10-02 MED ORDER — MIDAZOLAM HCL 2 MG/2ML IJ SOLN
1.0000 mg | Freq: Once | INTRAMUSCULAR | Status: AC
  Administered 2022-10-02: 1 mg via INTRAVENOUS

## 2022-10-02 MED ORDER — SODIUM CHLORIDE 0.9 % IV SOLN: INTRAVENOUS | Status: DC

## 2022-10-02 MED ORDER — IPRATROPIUM-ALBUTEROL 0.5-2.5 (3) MG/3ML IN SOLN
RESPIRATORY_TRACT | Status: AC
  Filled 2022-10-02: qty 3

## 2022-10-02 MED ORDER — DEXAMETHASONE SODIUM PHOSPHATE 10 MG/ML IJ SOLN
INTRAMUSCULAR | Status: DC | PRN
  Administered 2022-10-02: 10 mg via INTRAVENOUS

## 2022-10-02 MED ORDER — TRANEXAMIC ACID 1000 MG/10ML IV SOLN
INTRAVENOUS | Status: AC
  Filled 2022-10-02: qty 10

## 2022-10-02 MED ORDER — EPINEPHRINE PF 1 MG/ML IJ SOLN
INTRAMUSCULAR | Status: AC
  Filled 2022-10-02: qty 1

## 2022-10-02 MED ORDER — OXYCODONE HCL 5 MG PO TABS: 5.0000 mg | ORAL_TABLET | ORAL | Status: DC | PRN

## 2022-10-02 MED ORDER — BUPIVACAINE LIPOSOME 1.3 % IJ SUSP
INTRAMUSCULAR | Status: AC
  Filled 2022-10-02: qty 20

## 2022-10-02 MED ORDER — MIDAZOLAM HCL 2 MG/2ML IJ SOLN
INTRAMUSCULAR | Status: AC
  Filled 2022-10-02: qty 2

## 2022-10-02 MED ORDER — FENTANYL CITRATE (PF) 100 MCG/2ML IJ SOLN
INTRAMUSCULAR | Status: AC
  Filled 2022-10-02: qty 2

## 2022-10-02 MED ORDER — PHENYLEPHRINE HCL (PRESSORS) 10 MG/ML IV SOLN
INTRAVENOUS | Status: DC | PRN
  Administered 2022-10-02 (×3): 40 ug via INTRAVENOUS

## 2022-10-02 MED ORDER — FENTANYL CITRATE (PF) 100 MCG/2ML IJ SOLN
INTRAMUSCULAR | Status: DC | PRN
  Administered 2022-10-02: 50 ug via INTRAVENOUS
  Administered 2022-10-02 (×2): 25 ug via INTRAVENOUS

## 2022-10-02 MED ORDER — ACETAMINOPHEN 10 MG/ML IV SOLN: 1000.0000 mg | Freq: Once | INTRAVENOUS | Status: DC | PRN

## 2022-10-02 MED ORDER — BUPIVACAINE LIPOSOME 1.3 % IJ SUSP
INTRAMUSCULAR | Status: AC
  Filled 2022-10-02: qty 10

## 2022-10-02 MED ORDER — BUPIVACAINE HCL (PF) 0.5 % IJ SOLN
INTRAMUSCULAR | Status: AC
  Filled 2022-10-02: qty 30

## 2022-10-02 MED ORDER — KETOROLAC TROMETHAMINE 15 MG/ML IJ SOLN
INTRAMUSCULAR | Status: AC
  Filled 2022-10-02: qty 1

## 2022-10-02 MED ORDER — IPRATROPIUM-ALBUTEROL 0.5-2.5 (3) MG/3ML IN SOLN
3.0000 mL | Freq: Once | RESPIRATORY_TRACT | Status: AC
  Administered 2022-10-02: 3 mL via RESPIRATORY_TRACT

## 2022-10-02 MED ORDER — ONDANSETRON HCL 4 MG/2ML IJ SOLN
INTRAMUSCULAR | Status: DC | PRN
  Administered 2022-10-02: 4 mg via INTRAVENOUS

## 2022-10-02 MED ORDER — OXYCODONE HCL 5 MG/5ML PO SOLN: 5.0000 mg | Freq: Once | ORAL | Status: DC | PRN

## 2022-10-02 MED ORDER — BUPIVACAINE LIPOSOME 1.3 % IJ SUSP
INTRAMUSCULAR | Status: DC | PRN
  Administered 2022-10-02: 20 mL via PERINEURAL

## 2022-10-02 MED ORDER — ROCURONIUM BROMIDE 10 MG/ML (PF) SYRINGE
PREFILLED_SYRINGE | INTRAVENOUS | Status: AC
  Filled 2022-10-02: qty 10

## 2022-10-02 MED ORDER — CEFAZOLIN SODIUM-DEXTROSE 2-4 GM/100ML-% IV SOLN
INTRAVENOUS | Status: AC
  Filled 2022-10-02: qty 100

## 2022-10-02 MED ORDER — PHENYLEPHRINE 80 MCG/ML (10ML) SYRINGE FOR IV PUSH (FOR BLOOD PRESSURE SUPPORT)
PREFILLED_SYRINGE | INTRAVENOUS | Status: AC
  Filled 2022-10-02: qty 10

## 2022-10-02 MED ORDER — ACETAMINOPHEN 325 MG PO TABS: 325.0000 mg | ORAL_TABLET | Freq: Four times a day (QID) | ORAL | Status: DC | PRN

## 2022-10-02 MED ORDER — ONDANSETRON HCL 4 MG/2ML IJ SOLN
INTRAMUSCULAR | Status: AC
  Filled 2022-10-02: qty 2

## 2022-10-02 MED ORDER — SUCCINYLCHOLINE CHLORIDE 200 MG/10ML IV SOSY
PREFILLED_SYRINGE | INTRAVENOUS | Status: DC | PRN
  Administered 2022-10-02: 120 mg via INTRAVENOUS

## 2022-10-02 MED ORDER — ACETAMINOPHEN 10 MG/ML IV SOLN
INTRAVENOUS | Status: AC
  Filled 2022-10-02: qty 100

## 2022-10-02 MED ORDER — DEXAMETHASONE SODIUM PHOSPHATE 10 MG/ML IJ SOLN
INTRAMUSCULAR | Status: AC
  Filled 2022-10-02: qty 1

## 2022-10-02 MED ORDER — 0.9 % SODIUM CHLORIDE (POUR BTL) OPTIME
TOPICAL | Status: DC | PRN
  Administered 2022-10-02: 500 mL

## 2022-10-02 MED ORDER — SODIUM CHLORIDE FLUSH 0.9 % IV SOLN
INTRAVENOUS | Status: AC
  Filled 2022-10-02: qty 20

## 2022-10-02 MED ORDER — OXYCODONE HCL 5 MG PO TABS: 5.0000 mg | ORAL_TABLET | ORAL | 0 refills | Status: AC | PRN

## 2022-10-02 MED ORDER — ACETAMINOPHEN 10 MG/ML IV SOLN
INTRAVENOUS | Status: DC | PRN
  Administered 2022-10-02: 1000 mg via INTRAVENOUS

## 2022-10-02 MED ORDER — BUPIVACAINE HCL (PF) 0.5 % IJ SOLN
INTRAMUSCULAR | Status: DC | PRN
  Administered 2022-10-02: 10 mL via PERINEURAL

## 2022-10-02 MED ORDER — METOCLOPRAMIDE HCL 5 MG/ML IJ SOLN: 5.0000 mg | Freq: Three times a day (TID) | INTRAMUSCULAR | Status: DC | PRN

## 2022-10-02 MED ORDER — ONDANSETRON HCL 4 MG PO TABS: 4.0000 mg | ORAL_TABLET | Freq: Four times a day (QID) | ORAL | Status: DC | PRN

## 2022-10-02 MED ORDER — EPHEDRINE SULFATE (PRESSORS) 50 MG/ML IJ SOLN
INTRAMUSCULAR | Status: DC | PRN
  Administered 2022-10-02 (×2): 5 mg via INTRAVENOUS

## 2022-10-02 MED ORDER — BUPIVACAINE-EPINEPHRINE (PF) 0.5% -1:200000 IJ SOLN
INTRAMUSCULAR | Status: DC | PRN
  Administered 2022-10-02: 30 mL via PERINEURAL

## 2022-10-02 MED ORDER — ONDANSETRON HCL 4 MG/2ML IJ SOLN: 4.0000 mg | Freq: Once | INTRAMUSCULAR | Status: DC | PRN

## 2022-10-02 SURGICAL SUPPLY — 73 items
APL PRP STRL LF DISP 70% ISPRP (MISCELLANEOUS) ×3
BIT DRILL FLUTED 3.0 STRL (BIT) IMPLANT
BLADE SAW SAG 25X90X1.19 (BLADE) ×1 IMPLANT
BNDG CMPR 5X4 CHSV STRCH STRL (GAUZE/BANDAGES/DRESSINGS) ×1
BNDG COHESIVE 4X5 TAN STRL LF (GAUZE/BANDAGES/DRESSINGS) IMPLANT
BSPLAT GLND +2X24 MDLR (Joint) ×1 IMPLANT
CALIBRATOR GLENOID VIP 5-D (SYSTAGENIX WOUND MANAGEMENT) IMPLANT
CHLORAPREP W/TINT 26 (MISCELLANEOUS) ×1 IMPLANT
COMP HUM CUP REV SHLD 33 +2 RT (Shoulder) ×1 IMPLANT
COMPONENT HUM CUP SHLD33+2RT (Shoulder) IMPLANT
COOLER POLAR GLACIER W/PUMP (MISCELLANEOUS) ×1 IMPLANT
COVER BACK TABLE REUSABLE LG (DRAPES) ×1 IMPLANT
DRAPE 3/4 80X56 (DRAPES) ×1 IMPLANT
DRAPE INCISE IOBAN 66X45 STRL (DRAPES) ×1 IMPLANT
DRSG OPSITE POSTOP 4X10 (GAUZE/BANDAGES/DRESSINGS) IMPLANT
DRSG OPSITE POSTOP 4X8 (GAUZE/BANDAGES/DRESSINGS) ×1 IMPLANT
DRSG XEROFORM 1X8 (GAUZE/BANDAGES/DRESSINGS) IMPLANT
ELECT BLADE 6.5 EXT (BLADE) IMPLANT
ELECT CAUTERY BLADE 6.4 (BLADE) ×1 IMPLANT
ELECT REM PT RETURN 9FT ADLT (ELECTROSURGICAL) ×1
ELECTRODE REM PT RTRN 9FT ADLT (ELECTROSURGICAL) ×1 IMPLANT
GAUZE XEROFORM 1X8 LF (GAUZE/BANDAGES/DRESSINGS) ×1 IMPLANT
GLENOID UNI REV MOD 24 +2 LAT (Joint) IMPLANT
GLENOSPHERE 36 +4 LAT/24 (Joint) IMPLANT
GLOVE BIO SURGEON STRL SZ7.5 (GLOVE) ×4 IMPLANT
GLOVE BIO SURGEON STRL SZ8 (GLOVE) ×4 IMPLANT
GLOVE BIOGEL PI IND STRL 8 (GLOVE) ×2 IMPLANT
GLOVE INDICATOR 8.0 STRL GRN (GLOVE) ×1 IMPLANT
GOWN STRL REUS W/ TWL LRG LVL3 (GOWN DISPOSABLE) ×1 IMPLANT
GOWN STRL REUS W/ TWL XL LVL3 (GOWN DISPOSABLE) ×1 IMPLANT
GOWN STRL REUS W/TWL LRG LVL3 (GOWN DISPOSABLE) ×2
GOWN STRL REUS W/TWL XL LVL3 (GOWN DISPOSABLE) ×2
HOOD PEEL AWAY T7 (MISCELLANEOUS) ×3 IMPLANT
INSERT HUM 33 +3/36 COMBO (Insert) IMPLANT
IV NS IRRIG 3000ML ARTHROMATIC (IV SOLUTION) ×1 IMPLANT
KIT STABILIZATION SHOULDER (MISCELLANEOUS) ×1 IMPLANT
KIT TURNOVER KIT A (KITS) ×1 IMPLANT
MANIFOLD NEPTUNE II (INSTRUMENTS) ×1 IMPLANT
MASK FACE SPIDER DISP (MASK) ×1 IMPLANT
MAT ABSORB  FLUID 56X50 GRAY (MISCELLANEOUS) ×1
MAT ABSORB FLUID 56X50 GRAY (MISCELLANEOUS) ×1 IMPLANT
NDL MAYO CATGUT SZ1 (NEEDLE) IMPLANT
NDL SAFETY ECLIP 18X1.5 (MISCELLANEOUS) ×1 IMPLANT
NDL SPNL 20GX3.5 QUINCKE YW (NEEDLE) ×1 IMPLANT
NEEDLE MAYO CATGUT SZ1 (NEEDLE) IMPLANT
NEEDLE SPNL 20GX3.5 QUINCKE YW (NEEDLE) ×1 IMPLANT
NS IRRIG 500ML POUR BTL (IV SOLUTION) ×1 IMPLANT
PACK ARTHROSCOPY SHOULDER (MISCELLANEOUS) ×1 IMPLANT
PAD ARMBOARD 7.5X6 YLW CONV (MISCELLANEOUS) ×1 IMPLANT
PAD WRAPON POLAR SHDR UNIV (MISCELLANEOUS) ×1 IMPLANT
PIN NITINOL TARGETER 2.8 (PIN) IMPLANT
PULSAVAC PLUS IRRIG FAN TIP (DISPOSABLE) ×1
SCREW CENTRAL MODULAR 20 (Screw) IMPLANT
SCREW PERI LOCK 5.5X16 (Screw) IMPLANT
SCREW PERIPHERAL 5.5X28 LOCK (Screw) IMPLANT
SLING ULTRA II M (MISCELLANEOUS) IMPLANT
SPONGE T-LAP 18X18 ~~LOC~~+RFID (SPONGE) ×2 IMPLANT
STAPLER SKIN PROX 35W (STAPLE) ×1 IMPLANT
STEM HUMERAL MOD SZ 5 135 DEG (Stem) IMPLANT
SUT ETHIBOND 0 MO6 C/R (SUTURE) ×1 IMPLANT
SUT FIBERWIRE #2 38 BLUE 1/2 (SUTURE) ×4
SUT VIC AB 0 CT1 36 (SUTURE) ×1 IMPLANT
SUT VIC AB 2-0 CT1 (SUTURE) IMPLANT
SUT VIC AB 2-0 CT1 27 (SUTURE) ×2
SUT VIC AB 2-0 CT1 TAPERPNT 27 (SUTURE) ×2 IMPLANT
SUTURE FIBERWR #2 38 BLUE 1/2 (SUTURE) ×4 IMPLANT
SYR 10ML LL (SYRINGE) ×1 IMPLANT
SYR 30ML LL (SYRINGE) ×1 IMPLANT
SYR TOOMEY 50ML (SYRINGE) ×1 IMPLANT
TIP FAN IRRIG PULSAVAC PLUS (DISPOSABLE) ×1 IMPLANT
TRAP FLUID SMOKE EVACUATOR (MISCELLANEOUS) ×1 IMPLANT
WATER STERILE IRR 500ML POUR (IV SOLUTION) ×1 IMPLANT
WRAPON POLAR PAD SHDR UNIV (MISCELLANEOUS) ×1

## 2022-10-02 NOTE — Anesthesia Procedure Notes (Addendum)
Procedure Name: Intubation Date/Time: 10/02/2022 11:39 AM  Performed by: Morene Crocker, CRNAPre-anesthesia Checklist: Patient identified, Patient being monitored, Timeout performed, Emergency Drugs available and Suction available Patient Re-evaluated:Patient Re-evaluated prior to induction Oxygen Delivery Method: Circle system utilized Preoxygenation: Pre-oxygenation with 100% oxygen Induction Type: IV induction and Rapid sequence Ventilation: Mask ventilation without difficulty Laryngoscope Size: 3 and McGraph Grade View: Grade I Tube type: Oral Tube size: 6.5 mm Number of attempts: 1 Airway Equipment and Method: Stylet Placement Confirmation: ETT inserted through vocal cords under direct vision, positive ETCO2 and breath sounds checked- equal and bilateral Secured at: 20 cm Tube secured with: Tape Dental Injury: Teeth and Oropharynx as per pre-operative assessment

## 2022-10-02 NOTE — H&P (Signed)
History of Present Illness:  Emma Spears is a 67 y.o. female who presents for history and physical for an upcoming right reverse total shoulder arthroplasty to be done by Dr. Joice Lofts on October 02, 2022. The patient has been seen by Dr. Joice Lofts in regards to her right shoulder pain. She has a history of a left reverse shoulder arthroplasty done by Dr. Joice Lofts on May 22, 2022.  The patient's primary concern today is worsening right shoulder pain. She rates her pain at 6-7/10, and has been taking Mobic, tramadol, and/or Tylenol as necessary with limited benefit. Her symptoms are aggravated by any activities at or above shoulder level. She also has difficulty reaching behind her back. She has pain at night. She denies any recent injury to the shoulder, and denies any numbness or paresthesias down her arm to her hand.  Current Outpatient Medications:  alendronate (FOSAMAX) 70 MG tablet Take 70 mg by mouth every 7 (seven) days  escitalopram oxalate (LEXAPRO) 20 MG tablet Take 20 mg by mouth once daily. 3  gabapentin (NEURONTIN) 300 MG capsule Take 300 mg by mouth 3 (three) times daily TAKE 3 CAPSULES BY MOUTH 3 TIMES DAILY 3  hydroCHLOROthiazide (HYDRODIURIL) 25 MG tablet Take 25 mg by mouth once daily  hydrOXYzine (ATARAX) 25 MG tablet Take 25 mg by mouth once as needed for Anxiety  meloxicam (MOBIC) 7.5 MG tablet TAKE 1 TABLET BY MOUTH ONCE A DAY FOR JOINT PAIN  traZODone (DESYREL) 50 MG tablet TAKE 1 TABLET BY MOUTH AT BEDTIME FOR INSOMNIA  codeine-guaiFENesin 10-100 mg/5 mL oral liquid Take 5 mLs by mouth 3 (three) times daily as needed for cough. (Patient not taking: Reported on 09/26/2022)  docusate (COLACE) 100 MG capsule Take 1 capsule (100 mg total) by mouth 2 (two) times daily as needed for mild constipation. (Patient not taking: Reported on 09/26/2022)  guaiFENesin (MUCINEX) 600 mg SR tablet Take 600 mg by mouth 2 (two) times daily (Patient not taking: Reported on 09/26/2022)  traMADoL (ULTRAM) 50  mg tablet Take 0.5-1 tablets (25-50 mg total) by mouth every 8 (eight) hours as needed (Patient not taking: Reported on 09/26/2022) 21 tablet 0   Allergies:  Hydrocodone-Acetaminophen Nausea   Past Medical History:  Chicken pox  Hypertension   Past Surgical History:  CHOLECYSTECTOMY 1977  HYSTERECTOMY 1992  SPINAL CORD STIMULATOR 2003  Total hip arthroplasty anterior approach Left 05/28/2017 (Dr. Rosita Kea)  Total hip arthrplasty anterior approach Right 09/03/2017 (Dr. Rosita Kea)  Reverse left total shoulder arthroplasty with biceps tenodesis 05/22/2022 (Dr. Joice Lofts)  LUMBAR SURGERY   Family History:  High blood pressure (Hypertension) Mother  High blood pressure (Hypertension) Father  No Known Problems Sister  No Known Problems Brother   Social History:   Socioeconomic History:  Marital status: Widowed  Tobacco Use  Smoking status: Former  Smokeless tobacco: Never  Advertising account planner  Vaping status: Former  Substance and Sexual Activity  Alcohol use: No  Alcohol/week: 0.0 standard drinks of alcohol  Drug use: No  Sexual activity: Never  Partners: Male   Review of Systems:  A comprehensive 14 point ROS was performed, reviewed, and the pertinent orthopaedic findings are documented in the HPI.  Physical Exam: Vitals:  09/26/22 1016  BP: (!) 138/98  Weight: (!) 101.1 kg (222 lb 12.8 oz)  Height: 160 cm ( )  PainSc: 7  PainLoc: Shoulder   General/Constitutional: Pleasant overweight middle-aged female in no acute distress. Neuro/Psych: Normal mood and affect, oriented to person, place and time. Eyes: Non-icteric.  Pupils are equal, round, and reactive to light, and exhibit synchronous movement. ENT: Unremarkable. Lymphatic: No palpable adenopathy. Respiratory: Lungs are clear to auscultation. No wheezes and Non-labored breathing Cardiovascular: Regular rate and rhythm without murmurs. No edema, swelling or tenderness, except as noted in detailed exam. Integumentary: No impressive  skin lesions present, except as noted in detailed exam. Musculoskeletal: Unremarkable, except as noted in detailed exam.  Heart: Examination of the heart reveals regular, rate, and rhythm. There is no murmur noted on ascultation. There is a normal apical pulse.  Lungs: Lungs are clear to auscultation. There is no wheeze, rhonchi, or crackles. There is normal expansion of bilateral chest walls.   Right shoulder exam: Skin inspection of the right shoulder is unremarkable. No swelling, erythema, ecchymosis, abrasions, or other skin abnormalities are identified. She has mild tenderness palpation over the anterolateral aspect of the shoulder. Actively, she can forward flex to 90 degrees, abduct to 80 degrees, and internally rotate to the posterior aspect of her right iliac crest. Passively, she can tolerate forward flexion to 110 degrees and abduction to 95 degrees. At 90 degrees of abduction, she can tolerate external rotation to 35 degrees and internal rotation to 25 degrees. She notes moderate pain at the extremes of all motions. She exhibits 4-4+/5 strength with resisted forward flexion and abduction, and 4+/5 strength with resisted internal and external rotation. She is neurovascularly intact to the right upper extremity and hand.  X-rays/MRI/Lab data:  AP, Y-scapular, and axillary views of the right shoulder are obtained. These films demonstrate advanced degenerative changes with complete loss of the clear space, flattening of the humeral head, and osteophyte formation. The subacromial space is mildly decreased. She exhibits a type I-II acromion. No lytic lesions or fractures are identified.  Assessment: 1. Primary osteoarthritis of right shoulder.  2. Rotator cuff tendinitis, right.  3. Severe obesity (BMI 35.0-39.9) with comorbidity.   Plan: The patient is being seen regarding her right shoulder symptoms, the patient is quite frustrated by her symptoms and functional limitations, and is ready  to consider more aggressive treatment options. Based on her x-rays and physical examination findings, she would most benefit from a reverse right total shoulder arthroplasty. The procedure was discussed with the patient, as were the potential risks (including bleeding, infection, nerve and/or blood vessel injury, persistent or recurrent pain, loosening and/or failure of the components, dislocation, need for further surgery, blood clots, strokes, heart attacks and/or arhythmias, pneumonia, etc.) and benefits. The patient states his/her understanding and wishes to proceed. All of the patient's questions and concerns were answered. She can call any time with further concerns. She will follow up post-surgery, routine.    H&P reviewed and patient re-examined. No changes.

## 2022-10-02 NOTE — Progress Notes (Signed)
Informed Dr. Ronni Rumble pt O2 sats are 92% on RA.  When pt is sleepy O2 sats drop to 90% on RA.  Dr. Ronni Rumble stated ok to D/C home.  Instructed pt how to use incentive spirometer.  Pt returned demonstration.

## 2022-10-02 NOTE — Op Note (Signed)
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  Post-Op Diagnosis:   Same  Procedure:   Reverse right total shoulder arthroplasty with biceps tenodesis.  Surgeon:   Maryagnes Amos, MD  Assistant:   Griffin Basil, RNFA  Anesthesia:   General endotracheal with an interscalene block using Exparel placed preoperatively by the anesthesiologist.  Findings:   As above.  Complications:   None  EBL:   150 cc  Fluids:   700 cc crystalloid  UOP:   None  TT:   None  Drains:   None  Closure:   Staples  Implants:   All press-fit Arthrex system with a #5 Apex humeral stem, a 33 mm SutureCup with a +3 insert accommodating a 36 mm glenosphere, and a 24 mm base plate with a 36 mm +4 lateralized glenosphere.  Brief Clinical Note:   The patient is a 67 year old female with a long history of gradually worsening right shoulder pain and stiffness. Her symptoms have progressed despite medications, activity modification, etc. Her history and examination consistent with advanced degenerative joint disease as confirmed by both plain radiographs and CT scan. The patient presents at this time for a reverse right total shoulder arthroplasty.  Procedure:   The patient underwent placement of an interscalene block using Exparel by the anesthesiologist in the preoperative holding area before being brought into the operating room and lain in the supine position. The patient then underwent general endotracheal intubation and anesthesia before the patient was repositioned in the beach chair position using the beach chair positioner. The right shoulder and upper extremity were prepped with ChloraPrep solution before being draped sterilely. Preoperative antibiotics were administered. A timeout was performed to verify the appropriate surgical site.    A standard anterior approach to the shoulder was made through an  approximately 4-5 inch incision. The incision was carried down through the subcutaneous tissues to expose the deltopectoral fascia. The interval between the deltoid and pectoralis muscles was identified and this plane developed, retracting the cephalic vein laterally with the deltoid muscle. The conjoined tendon was identified. Its lateral margin was dissected and the Kolbel self-retraining retractor inserted. The "three sisters" were identified and cauterized. Bursal tissues were removed to improve visualization.   The biceps tendon was identified near the inferior aspect of the bicipital groove. A soft tissue tenodesis was performed by attaching the biceps tendon to the adjacent pectoralis major tendon using two #0 Ethibond interrupted sutures. The biceps tendon was then transected just proximal to the tenodesis site. The subscapularis tendon was released from its attachment to the lesser tuberosity 1 cm proximal to its insertion and several tagging sutures placed. The inferior capsule was released with care after identifying and protecting the axillary nerve. The proximal humeral cut was made at approximately 30 of retroversion using the extra-medullary guide.   Attention was redirected to the glenoid. The labrum was debrided circumferentially before the center of the glenoid was marked with electrocautery. The guidewire was drilled into the glenoid neck using the appropriate guide. After verifying its position, it was overreamed with the baseplate reamer to create a flat surface before the peripheral reamer was introduced to clean off any peripheral soft tissues. The central 10 mm coring reamer was then inserted before the hole was tapped to complete the glenoid preparation. The permanent mini-baseplate construct with a 20 mm screw attachment was screwed into place and tightened securely.  The baseplate was then further secured using four peripheral locking screws. The permanent 36 mm +4 mm lateralized  glenosphere was then impacted into place and its Morse taper locking mechanism verified using manual distraction. Finally, the glenosphere locking screw was inserted and tightened securely.  Attention was directed to the humeral side. The humeral canal was reamed with first the 5 mm and then the 6 mm reamer. The canal was broached beginning with a #5 broach, which was impacted quite tightly. This was left in place and the dirty 3 mm metaphyseal inset reamer was used to create the metaphyseal socket.  A trial reduction performed using the 33 mm trial humeral platform with the +3 mm insert adapted for a 36 mm glenosphere. The arm demonstrated excellent range of motion as the hand could be brought across the chest to the opposite shoulder and brought to the top of the patient's head and to the patient's ear. The shoulder appeared stable throughout this range of motion. The joint was dislocated and the trial components removed. The permanent #5 Apex stem was impacted into place with care taken to maintain the appropriate version. The permanent 33 mm SutureCup humeral platform was then impacted into place before the +3 mm insert with the 36 mm glenosphere accommodation was snapped into place.The shoulder was relocated using two finger pressure and again placed through a range of motion with the findings as described above.  The wound was copiously irrigated with sterile saline solution using the jet lavage system before a total of 30 cc of 0.5% Sensorcaine with epinephrine was injected into the pericapsular and peri-incisional tissues to help with postoperative analgesia. The subscapularis tendon was reapproximated using #2 FiberWire interrupted sutures. The deltopectoral interval was closed using #0 Vicryl interrupted sutures before the subcutaneous tissues were closed using 2-0 Vicryl interrupted sutures. The skin was closed using staples. Prior to closing the skin, 1 g of transexemic acid in 10 cc of normal saline  was injected intra-articularly to help with postoperative bleeding. A sterile occlusive dressing was applied to the wound before the arm was placed into a shoulder immobilizer with an abduction pillow. A Polar Care system also was applied to the shoulder. The patient was then transferred back to a hospital bed before being awakened, extubated, and returned to the recovery room in satisfactory condition after tolerating the procedure well.

## 2022-10-02 NOTE — Transfer of Care (Signed)
Immediate Anesthesia Transfer of Care Note  Patient: Emma Spears  Procedure(s) Performed: REVERSE SHOULDER ARTHROPLASTY WITH BICEPS TENODESIS - RNFA (Right: Shoulder)  Patient Location: PACU  Anesthesia Type:General  Level of Consciousness: drowsy  Airway & Oxygen Therapy: Patient Spontanous Breathing and Patient connected to face mask oxygen  Post-op Assessment: Report given to RN and Post -op Vital signs reviewed and stable  Post vital signs: Reviewed and stable  Last Vitals:  Vitals Value Taken Time  BP 151/76 10/02/22 1430  Temp 37 C 10/02/22 1430  Pulse 81 10/02/22 1436  Resp 21 10/02/22 1436  SpO2 100 % 10/02/22 1436  Vitals shown include unvalidated device data.  Last Pain:  Vitals:   10/02/22 0851  TempSrc: Temporal  PainSc: 0-No pain         Complications: No notable events documented.

## 2022-10-02 NOTE — Discharge Instructions (Addendum)
Orthopedic discharge instructions: May shower with intact OpSite dressing once nerve block has worn off (approximately Monday morning). Apply ice frequently to shoulder or use Polar Care device. Resume meloxicam 7.5 mg daily with food for 7-10 days, then as necessary. Take oxycodone as prescribed when needed.  May supplement with ES Tylenol if necessary. Keep shoulder immobilizer on at all times except may remove for bathing purposes. Follow-up in 10-14 days or as scheduled.  AMBULATORY SURGERY  DISCHARGE INSTRUCTIONS   The drugs that you were given will stay in your system until tomorrow so for the next 24 hours you should not:  Drive an automobile Make any legal decisions Drink any alcoholic beverage   You may resume regular meals tomorrow.  Today it is better to start with liquids and gradually work up to solid foods.  You may eat anything you prefer, but it is better to start with liquids, then soup and crackers, and gradually work up to solid foods.   Please notify your doctor immediately if you have any unusual bleeding, trouble breathing, redness and pain at the surgery site, drainage, fever, or pain not relieved by medication.    Additional Instructions:   Please contact your physician with any problems or Same Day Surgery at [phone], Monday through Friday 6 am to 4 pm, or [place] at [facility] number at [phone]. POLAR CARE INFORMATION  MassAdvertisement.it  How to use [employer] Polar Care [facility]?  YouTube   ShippingScam.co.uk  OPERATING INSTRUCTIONS  Start the product With dry hands, connect the transformer to the electrical connection located on the top of the cooler. Next, plug the transformer into an appropriate electrical outlet. The unit will automatically start running at this point.  To stop the pump, disconnect electrical power.  Unplug to stop the product when not in use. Unplugging the Polar Care unit  turns it off. Always unplug immediately after use. Never leave it plugged in while unattended. Remove pad.    FIRST ADD WATER TO FILL LINE, THEN ICE---Replace ice when existing ice is almost melted  1 Discuss Treatment with your Licensed Health Care Practitioner and Use Only as Prescribed 2 Apply Insulation Barrier & Cold Therapy Pad 3 Check for Moisture 4 Inspect Skin Regularly  Tips and Trouble Shooting Usage Tips 1. Use cubed or chunked ice for optimal performance. 2. It is recommended to drain the Pad between uses. To drain the pad, hold the Pad upright with the hose pointed toward the ground. Depress the black plunger and allow water to drain out. 3. You may disconnect the Pad from the unit without removing the pad from the affected area by depressing the silver tabs on the hose coupling and gently pulling the hoses apart. The Pad and unit will seal itself and will not leak. Note: Some dripping during release is normal. 4. DO NOT RUN PUMP WITHOUT WATER! The pump in this unit is designed to run with water. Running the unit without water will cause permanent damage to the pump. 5. Unplug unit before removing lid.  TROUBLESHOOTING GUIDE Pump not running, Water not flowing to the pad, Pad is not getting cold 1. Make sure the transformer is plugged into the wall outlet. 2. Confirm that the ice and water are filled to the indicated levels. 3. Make sure there are no kinks in the pad. 4. Gently pull on the blue tube to make sure the tube/pad junction is straight. 5. Remove the pad from the treatment site and ll  it while the pad is lying at; then reapply. 6. Confirm that the pad couplings are securely attached to the unit. Listen for the double clicks (Figure 1) to confirm the pad couplings are securely attached.  Leaks    Note: Some condensation on the lines, controller, and pads is unavoidable, especially in warmer climates. 1. If using a Breg Polar Care Cold Therapy unit with a detachable  Cold Therapy Pad, and a leak exists (other than condensation on the lines) disconnect the pad couplings. Make sure the silver tabs on the couplings are depressed before reconnecting the pad to the pump hose; then confirm both sides of the coupling are properly clicked in. 2. If the coupling continues to leak or a leak is detected in the pad itself, stop using it and call [employer] Customer Care at [phone].  Cleaning After use, empty and dry the unit with a soft cloth. Warm water and mild detergent may be used occasionally to clean the pump and tubes.  WARNING: The Polar Care Cube can be cold enough to cause serious injury, including full skin necrosis. Follow these Operating Instructions, and carefully read the Product Insert (see pouch on side of unit) and the Cold Therapy Pad Fitting Instructions (provided with each Cold Therapy Pad) prior to use.       SHOULDER SLING IMMOBILIZER   VIDEO Slingshot 2 Shoulder Brace Application - YouTube ---https://www.porter.info/  INSTRUCTIONS While supporting the injured arm, slide the forearm into the sling. Wrap the adjustable shoulder strap around the neck and shoulders and attach the strap end to the sling using  the "alligator strap tab."  Adjust the shoulder strap to the required length. Position the shoulder pad behind the neck. To secure the shoulder pad location (optional), pull the shoulder strap away from the shoulder pad, unfold the hook material on the top of the pad, then press the shoulder strap back onto the hook material to secure the pad in place. Attach the closure strap across the open top of the sling. Position the strap so that it holds the arm securely in the sling. Next, attach the thumb strap to the open end of the sling between the thumb and fingers. After sling has been fit, it may be easily removed and reapplied using the quick release buckle on shoulder strap. If a neutral pillow or 15 abduction pillow is  included, place the pillow at the waistline. Attach the sling to the pillow, lining up hook material on the pillow with the loop on sling. Adjust the waist strap to fit.  If waist strap is too long, cut it to fit. Use the small piece of double sided hook material (located on top of the pillow) to secure the strap end. Place the double sided hook material on the inside of the cut strap end and secure it to the waist strap.     If no pillow is included, attach the waist strap to the sling and adjust to fit.    Washing Instructions: Straps and sling must be removed and cleaned regularly depending on your activity level and perspiration. Hand wash straps and sling in cold water with mild detergent, rinse, air dry

## 2022-10-02 NOTE — Anesthesia Preprocedure Evaluation (Signed)
Anesthesia Evaluation  Patient identified by MRN, date of birth, ID band Patient awake  General Assessment Comment:  Patient says that after her recent shoulder surgery, she got aspiration pneumonia. (She was discharged from outpatient surgery, came back with respiratory distress, held in ED for 24 hours then sent home with treatment).  Reviewed: Allergy & Precautions, NPO status , Patient's Chart, lab work & pertinent test results  History of Anesthesia Complications (+) history of anesthetic complications  Airway Mallampati: II  TM Distance: >3 FB Neck ROM: Full    Dental no notable dental hx. (+) Teeth Intact   Pulmonary neg sleep apnea, COPD, Patient abstained from smoking.Not current smoker, former smoker Patient says that after her recent shoulder surgery, she got aspiration pneumonia. (She was discharged from outpatient surgery, came back with respiratory distress, held in ED for 24 hours then sent home with treatment).   Pulmonary exam normal breath sounds clear to auscultation       Cardiovascular Exercise Tolerance: Good METShypertension, Pt. on medications (-) CAD and (-) Past MI (-) dysrhythmias  Rhythm:Regular Rate:Normal - Systolic murmurs    Neuro/Psych  PSYCHIATRIC DISORDERS Anxiety Depression    negative neurological ROS     GI/Hepatic ,neg GERD  ,,(+)     (-) substance abuse    Endo/Other  neg diabetes    Renal/GU negative Renal ROS     Musculoskeletal   Abdominal   Peds  Hematology   Anesthesia Other Findings Past Medical History: No date: Anxiety No date: Arthritis No date: COPD (chronic obstructive pulmonary disease) No date: Depression No date: Hypertension No date: Pneumonia  Reproductive/Obstetrics                             Anesthesia Physical Anesthesia Plan  ASA: 2  Anesthesia Plan: General   Post-op Pain Management: Ofirmev IV (intra-op)* and  Regional block*   Induction: Intravenous and Rapid sequence  PONV Risk Score and Plan: 3 and Ondansetron, Dexamethasone and Midazolam  Airway Management Planned: Oral ETT and Video Laryngoscope Planned  Additional Equipment: None  Intra-op Plan:   Post-operative Plan: Extubation in OR  Informed Consent: I have reviewed the patients History and Physical, chart, labs and discussed the procedure including the risks, benefits and alternatives for the proposed anesthesia with the patient or authorized representative who has indicated his/her understanding and acceptance.     Dental advisory given  Plan Discussed with: CRNA and Surgeon  Anesthesia Plan Comments: (Discussed risks of anesthesia with patient, including PONV, sore throat, lip/dental/eye damage. Rare risks discussed as well, such as cardiorespiratory and neurological sequelae, and allergic reactions. Discussed the role of CRNA in patient's perioperative care. Patient understands. Discussed r/b/a of interscalene block, including elective nature. Risks discussed: - Rare: bleeding, infection, nerve damage - shortness of breath from hemidiaphragmatic paralysis - unilateral horner's syndrome - poor/non-working blocks - reactions and toxicity to local anesthetic Patient understands and agrees. She was very satisfied with her previous block, said it lasted about 4 days. )       Anesthesia Quick Evaluation

## 2022-10-02 NOTE — Anesthesia Procedure Notes (Signed)
Anesthesia Regional Block: Interscalene brachial plexus block   Pre-Anesthetic Checklist: , timeout performed,  Correct Patient, Correct Site, Correct Laterality,  Correct Procedure, Correct Position, site marked,  Risks and benefits discussed,  Surgical consent,  Pre-op evaluation,  At surgeon's request and post-op pain management  Laterality: Right  Prep: chloraprep       Needles:  Injection technique: Single-shot  Needle Type: Echogenic Needle     Needle Length: 4cm  Needle Gauge: 25     Additional Needles:   Procedures:,,,, ultrasound used (permanent image in chart),,    Narrative:  Injection made incrementally with aspirations every 5 mL.  Performed by: Personally  Anesthesiologist: Corinda Gubler, MD  Additional Notes: Patient's chart reviewed and they were deemed appropriate candidate for procedure, at surgeon's request. Patient educated about risks, benefits, and alternatives of the block including but not limited to: temporary or permanent nerve damage, bleeding, infection, damage to surround tissues, pneumothorax, hemidiaphragmatic paralysis, unilateral Horner's syndrome, block failure, local anesthetic toxicity. Patient expressed understanding. A formal time-out was conducted consistent with institution rules.  Monitors were applied, and minimal sedation used (see nursing record). The site was prepped with skin prep and allowed to dry, and sterile gloves were used. A high frequency linear ultrasound probe with probe cover was utilized throughout. C5-7 nerve roots located and appeared anatomically normal, local anesthetic injected around them, and echogenic block needle trajectory was monitored throughout. Aspiration performed every 5ml. Lung and blood vessels were avoided. All injections were performed without resistance and free of blood and paresthesias. The patient tolerated the procedure well.  Injectate: 20ml exparel + 10ml 0.5% bupivacaine

## 2022-10-02 NOTE — Evaluation (Signed)
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 Previous L TSA december 2024.   Clinical Impression   Pt seated in recliner chair with daughter present in room. Pt does live with different daughter and pt believes she is proficient in assisting pt with sling, exercises, and polar care at home since prior L TSA 4 months ago. OT did review sling instructions, exercises, polar care, NWB precautions, and self care techniques while assisting pt with getting dressed during evaluation. Pt feels confident about being able to safety manage with family assistance at home. She remains on 1L via [place] during session. Education completed and OT to complete orders at this time.      Recommendations for follow up therapy are one component of a multi-disciplinary discharge planning process, led by the attending physician.  Recommendations may be updated based on patient status, additional functional criteria and insurance authorization.   Assistance Recommended at Discharge Intermittent Supervision/Assistance  Patient can return home with the following A little help with bathing/dressing/bathroom;Assistance with cooking/housework;Assist for transportation;Help with stairs or ramp for entrance;A little help with walking and/or transfers       Equipment Recommendations  None recommended by OT       Precautions / Restrictions Precautions Precautions: Fall;Shoulder Shoulder Interventions: Roe Coombs joy ultra sling;Shoulder abduction pillow;At all times;Off for dressing/bathing/exercises Precaution Booklet Issued: Yes (comment) Restrictions Weight Bearing Restrictions: Yes RUE Weight Bearing: Non weight bearing      Mobility Bed Mobility               General bed mobility comments: seated in recliner chair     Transfers Overall transfer level: Needs assistance   Transfers: Sit to/from Stand Sit to Stand: Supervision                      ADL either performed or assessed with clinical judgement   ADL Overall ADL's : Needs assistance/impaired                                       General ADL Comments: Pt needing assistance to thread LB clothing over B feet and she stands with supervision to pull over B hips. Mod A for UB self care secondary to precautions and assist to don polar care system and sling.     Vision Patient Visual Report: No change from baseline              Pertinent Vitals/Pain Pain Assessment Pain Assessment: No/denies pain     Hand Dominance Right   Extremity/Trunk Assessment Upper Extremity Assessment Upper Extremity Assessment:  (shoulder surgery)   Lower Extremity Assessment Lower Extremity Assessment: Overall WFL for tasks assessed       Communication Communication Communication: No difficulties   Cognition Arousal/Alertness: Awake/alert Behavior During Therapy: WFL for tasks assessed/performed Overall Cognitive Status: Within Functional Limits for tasks assessed                                                  Home Living Family/patient expects to be discharged to:: Private residence Living Arrangements: Children;Other relatives Available Help at  Discharge: Family;Available 24 hours/day Type of Home: House Home Access: Stairs to enter [employer] of Steps: 3 STE, R rail Entrance Stairs-Rails: Right Home Layout: One level     Bathroom Shower/Tub: Chief Strategy Officer: Handicapped height     Home Equipment: Grab bars - tub/shower;Rolling Environmental consultant (2 wheels);Rollator (4 wheels)          Prior Functioning/Environment Prior Level of Function : Needs assist;Independent/Modified Independent             Mobility Comments: Uses rollator for longer community distances,  denies falls hx. ADLs Comments: Some assistance from family as needed. Family assists with IADLs and driving                 OT Goals(Current goals can be found in the care plan section) Acute Rehab OT Goals Patient Stated Goal: to go home OT Goal Formulation: With patient Time For Goal Achievement: 10/02/22 Potential to Achieve Goals: Fair  OT Frequency:         AM-PAC OT "6 Clicks" Daily Activity     Outcome Measure Help from another person eating meals?: None Help from another person taking care of personal grooming?: None Help from another person toileting, which includes using toliet, bedpan, or urinal?: A Little Help from another person bathing (including washing, rinsing, drying)?: A Little Help from another person to put on and taking off regular upper body clothing?: A Lot Help from another person to put on and taking off regular lower body clothing?: A Little 6 Click Score: 19   End of Session Nurse Communication: Mobility status  Activity Tolerance: Patient tolerated treatment well Patient left: in chair;with family/visitor present;with nursing/sitter in room                   Time: 4098-1191 OT Time Calculation (min): 22 min Charges:  OT General Charges $OT Visit: 1 Visit OT Evaluation $OT Eval Low Complexity: 1 Low OT Treatments $Self Care/Home Management : 8-22 mins  Jackquline Denmark, MS, OTR/L , CBIS ascom [phone]  10/02/22, 4:22 PM

## 2022-10-03 ENCOUNTER — Encounter: Payer: Self-pay | Admitting: Surgery

## 2022-10-03 NOTE — Anesthesia Postprocedure Evaluation (Signed)
Anesthesia Post Note  Patient: Emma Spears  Procedure(s) Performed: REVERSE SHOULDER ARTHROPLASTY WITH BICEPS TENODESIS - RNFA (Right: Shoulder)  Patient location during evaluation: PACU Anesthesia Type: General Level of consciousness: awake and alert Pain management: pain level controlled Vital Signs Assessment: post-procedure vital signs reviewed and stable Respiratory status: spontaneous breathing, nonlabored ventilation, respiratory function stable and patient connected to nasal cannula oxygen Cardiovascular status: blood pressure returned to baseline and stable Postop Assessment: no apparent nausea or vomiting Anesthetic complications: no   No notable events documented.   Last Vitals:  Vitals:   10/02/22 1555 10/02/22 1621  BP: 122/79   Pulse: 87   Resp: 18   Temp:    SpO2: 93% 92%    Last Pain:  Vitals:   10/02/22 1555  TempSrc:   PainSc: 0-No pain                 Corinda Gubler

## 2022-10-06 ENCOUNTER — Other Ambulatory Visit: Payer: Self-pay | Admitting: Acute Care

## 2022-10-06 DIAGNOSIS — Z87891 Personal history of nicotine dependence: Secondary | ICD-10-CM

## 2022-10-06 DIAGNOSIS — Z122 Encounter for screening for malignant neoplasm of respiratory organs: Secondary | ICD-10-CM

## 2022-10-06 LAB — SURGICAL PATHOLOGY

## 2023-02-21 ENCOUNTER — Emergency Department: Payer: Medicare HMO

## 2023-02-21 ENCOUNTER — Emergency Department
Admission: EM | Admit: 2023-02-21 | Discharge: 2023-02-21 | Disposition: A | Discharge status: Home / Self Care | Payer: Medicare HMO | Attending: Emergency Medicine | Admitting: Emergency Medicine

## 2023-02-21 ENCOUNTER — Other Ambulatory Visit: Payer: Self-pay

## 2023-02-21 DIAGNOSIS — R051 Acute cough: Secondary | ICD-10-CM | POA: Insufficient documentation

## 2023-02-21 DIAGNOSIS — J449 Chronic obstructive pulmonary disease, unspecified: Secondary | ICD-10-CM | POA: Insufficient documentation

## 2023-02-21 DIAGNOSIS — Z1152 Encounter for screening for COVID-19: Secondary | ICD-10-CM | POA: Insufficient documentation

## 2023-02-21 DIAGNOSIS — I10 Essential (primary) hypertension: Secondary | ICD-10-CM | POA: Diagnosis not present

## 2023-02-21 DIAGNOSIS — R0789 Other chest pain: Secondary | ICD-10-CM | POA: Diagnosis present

## 2023-02-21 LAB — RESP PANEL BY RT-PCR (RSV, FLU A&B, COVID)  RVPGX2
Influenza A by PCR: NEGATIVE
Influenza B by PCR: NEGATIVE
Resp Syncytial Virus by PCR: NEGATIVE
SARS Coronavirus 2 by RT PCR: NEGATIVE

## 2023-02-21 MED ORDER — LIDOCAINE 5 % EX PTCH
1.0000 | MEDICATED_PATCH | CUTANEOUS | Status: DC
  Administered 2023-02-21: 1 via TRANSDERMAL
  Filled 2023-02-21: qty 1

## 2023-02-21 MED ORDER — NAPROXEN 500 MG PO TABS: 500.0000 mg | ORAL_TABLET | Freq: Two times a day (BID) | ORAL | 0 refills | Status: AC

## 2023-02-21 MED ORDER — OXYCODONE-ACETAMINOPHEN 5-325 MG PO TABS
1.0000 | ORAL_TABLET | Freq: Once | ORAL | Status: AC
  Administered 2023-02-21: 1 via ORAL
  Filled 2023-02-21: qty 1

## 2023-02-21 MED ORDER — PREDNISONE 20 MG PO TABS
40.0000 mg | ORAL_TABLET | Freq: Once | ORAL | Status: AC
  Administered 2023-02-21: 40 mg via ORAL
  Filled 2023-02-21: qty 2

## 2023-02-21 MED ORDER — OXYCODONE-ACETAMINOPHEN 5-325 MG PO TABS: 1.0000 | ORAL_TABLET | Freq: Four times a day (QID) | ORAL | 0 refills | Status: AC | PRN

## 2023-02-21 MED ORDER — PREDNISONE 20 MG PO TABS: 40.0000 mg | ORAL_TABLET | Freq: Every day | ORAL | 0 refills | Status: AC

## 2023-02-21 MED ORDER — IBUPROFEN 600 MG PO TABS
600.0000 mg | ORAL_TABLET | Freq: Once | ORAL | Status: AC
  Administered 2023-02-21: 600 mg via ORAL
  Filled 2023-02-21: qty 1

## 2023-02-21 NOTE — ED Notes (Signed)
Pt instructed on incentive spirometer. Pt able to perform the IS. Pt achieved 1250 x5. Provider notified.

## 2023-02-21 NOTE — ED Provider Notes (Signed)
[facility] Provider Note    Event Date/Time   First MD Initiated Contact with Patient 02/21/23 2143     (approximate)   History   No chief complaint on file.   HPI  Emma Spears is a 67 year old female with history of possible COPD, hypertension presenting to the emergency department for evaluation of chest wall pain.  Patient reports that for the past few days she has had an ongoing cough.  More recently when she is coughing she has pain along her ribs of her lower rib cage bilaterally.  Does report a cough, nonproductive.  Says she was told that she likely had early COPD by a doctor a while ago and has an as needed albuterol inhaler, but does not take any medicines regularly for this.  Her pain is not specifically pleuritic or exertional.     Physical Exam   Triage Vital Signs: ED Triage Vitals  Encounter Vitals Group     BP 02/21/23 2037 112/80     Systolic BP Percentile --      Diastolic BP Percentile --      Pulse Rate 02/21/23 2037 80     Resp 02/21/23 2037 20     Temp 02/21/23 2037 99.1 F (37.3 C)     Temp Source 02/21/23 2037 Oral     SpO2 02/21/23 2037 95 %     Weight --      Height 02/21/23 2035 5\' 3"  (1.6 m)     Head Circumference --      Peak Flow --      Pain Score 02/21/23 2035 7     Pain Loc --      Pain Education --      Exclude from Growth Chart --     Most recent vital signs: Vitals:   02/21/23 2037  BP: 112/80  Pulse: 80  Resp: 20  Temp: 99.1 F (37.3 C)  SpO2: 95%     General: Awake, interactive  HEENT: TMs clear bilaterally, posterior oropharynx nonerythematous, no tonsillar exudates CV:  Regular rate, good peripheral perfusion.  Resp:  Lungs clear, unlabored respirations, I-S 1250 Chest wall: Readily reproducible tenderness to palpation over the lower rib cage bilaterally without point area of tenderness Abd:  Soft, nondistended.  Neuro:  Symmetric facial movement, fluid speech   ED Results /  Procedures / Treatments   Labs (all labs ordered are listed, but only abnormal results are displayed) Labs Reviewed  RESP PANEL BY RT-PCR (RSV, FLU A&B, COVID)  [id number]     EKG EKG independently reviewed interpreted by myself (ER attending) demonstrates:    RADIOLOGY Imaging independently reviewed and interpreted by myself demonstrates:  CXR without focal consolidation or visible rib fracture  PROCEDURES:  Critical Care performed: No  Procedures   MEDICATIONS ORDERED IN ED: Medications  lidocaine (LIDODERM) 5 % 1 patch (1 patch Transdermal Patch Applied 02/21/23 2201)  ibuprofen (ADVIL) tablet 600 mg (600 mg Oral Given 02/21/23 2200)  oxyCODONE-acetaminophen (PERCOCET/ROXICET) 5-325 MG per tablet 1 tablet (1 tablet Oral Given 02/21/23 2200)  predniSONE (DELTASONE) tablet 40 mg (40 mg Oral Given 02/21/23 2200)     IMPRESSION / MDM / ASSESSMENT AND PLAN / ED COURSE  I reviewed the triage vital signs and the nursing notes.  Differential diagnosis includes, but is not limited to, Viral illness, pneumonia, pleurisy, musculoskeletal pain, very low suspicion for ACS, PE based on clinical history and physical exam  Patient's presentation is most consistent with  acute illness / injury with system symptoms.  67 year old female presenting with cough and chest wall pain.  Vital signs stable on presentation.  Viral swab negative.  X-[name] without pneumonia or visible rib fracture.  Did discuss limitations of this with the patient, but in the absence of trauma, overall lower suspicion for rib fracture.  Suspect likely musculoskeletal worse inflammatory pain in the setting of her acute respiratory illness.  She was given multimodal pain control with improvement in her symptoms.  Possible COPD, has an albuterol inhaler at home.  Will DC with a prescription for prednisone as well as continued multimodal pain control.  Strict return precautions provided.  Patient discharged in stable condition.       FINAL CLINICAL IMPRESSION(S) / ED DIAGNOSES   Final diagnoses:  Acute cough  Chest wall pain     Rx / DC Orders   ED Discharge Orders          Ordered    predniSONE (DELTASONE) 20 MG tablet  Daily with breakfast        02/21/23 2303    oxyCODONE-acetaminophen (PERCOCET) 5-325 MG tablet  Every 6 hours PRN        02/21/23 2303    naproxen (NAPROSYN) 500 MG tablet  2 times daily with meals        02/21/23 2303             Note:  This document was prepared using Dragon voice recognition software and may include unintentional dictation errors.   Trinna Post, MD 02/21/23 [phone]

## 2023-02-21 NOTE — Discharge Instructions (Addendum)
You were seen in the emergency department today for evaluation of your cough and chest wall pain.  Your x-Emma Spears did not show signs of pneumonia or rib fracture.  I suspect you may have a viral illness that has flared some underlying COPD.  You can continue to take Tylenol to help with your pain.  I sent a prescription for an anti-inflammatory and pain medicine called naproxen that you can take regularly.  If you have breakthrough pain, you can take Percocet.  Do not drive or operate machinery when taking this.  I have also sent a short course of a steroid that might help with your cough.  Return to the ER for new or worsening symptoms.

## 2023-02-21 NOTE — ED Notes (Signed)
Pt ambulated down the hall to another room so that the provider could use the otoscope. Pt back to bed. Call light within reach.

## 2023-02-21 NOTE — ED Triage Notes (Signed)
BIB [facility] from home. Rib pain after a coughing fit. Reports cough since yesterday. Breathing unlabored and speaking in full sentences with EMS.   EMS Vitals:   95% RA hx of COPD HR 80 126/86 98.4 temp 123 CBG

## 2023-08-12 ENCOUNTER — Other Ambulatory Visit: Payer: Self-pay | Admitting: Primary Care

## 2023-08-12 ENCOUNTER — Ambulatory Visit
Admission: RE | Admit: 2023-08-12 | Discharge: 2023-08-12 | Disposition: A | Discharge status: Home / Self Care | Source: Ambulatory Visit | Attending: Primary Care | Admitting: *Deleted

## 2023-08-12 ENCOUNTER — Ambulatory Visit
Admission: RE | Admit: 2023-08-12 | Discharge: 2023-08-12 | Disposition: A | Discharge status: Home / Self Care | Source: Ambulatory Visit | Attending: Primary Care | Admitting: Primary Care

## 2023-08-12 DIAGNOSIS — Z8709 Personal history of other diseases of the respiratory system: Secondary | ICD-10-CM

## 2023-08-12 DIAGNOSIS — R634 Abnormal weight loss: Secondary | ICD-10-CM

## 2023-09-24 ENCOUNTER — Other Ambulatory Visit: Payer: Self-pay | Admitting: Primary Care

## 2023-09-24 DIAGNOSIS — Z1231 Encounter for screening mammogram for malignant neoplasm of breast: Secondary | ICD-10-CM

## 2023-10-13 ENCOUNTER — Other Ambulatory Visit: Payer: Self-pay | Admitting: Acute Care

## 2023-10-13 DIAGNOSIS — Z122 Encounter for screening for malignant neoplasm of respiratory organs: Secondary | ICD-10-CM

## 2023-10-13 DIAGNOSIS — Z87891 Personal history of nicotine dependence: Secondary | ICD-10-CM

## 2023-10-20 ENCOUNTER — Ambulatory Visit: Admission: RE | Admit: 2023-10-20 | Source: Ambulatory Visit
